# Patient Record
Sex: Female | Born: 1985 | Race: White | Hispanic: No | Marital: Married | Smoking: Current every day smoker
Health system: Southern US, Community
[De-identification: ages and names within clinical notes are randomized; demographics above are authoritative.]

## PROBLEM LIST (undated history)

## (undated) DIAGNOSIS — Z789 Other specified health status: Secondary | ICD-10-CM

## (undated) HISTORY — PX: OTHER SURGICAL HISTORY: SHX169

---
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
## 2005-01-04 ENCOUNTER — Emergency Department (HOSPITAL_COMMUNITY): Admission: EM | Admit: 2005-01-04 | Discharge: 2005-01-05 | Payer: Self-pay | Admitting: Emergency Medicine

## 2008-07-01 ENCOUNTER — Emergency Department (HOSPITAL_COMMUNITY): Admission: EM | Admit: 2008-07-01 | Discharge: 2008-07-01 | Payer: Self-pay | Admitting: Family Medicine

## 2008-08-19 ENCOUNTER — Emergency Department (HOSPITAL_COMMUNITY): Admission: EM | Admit: 2008-08-19 | Discharge: 2008-08-19 | Payer: Self-pay | Admitting: Family Medicine

## 2008-09-12 ENCOUNTER — Emergency Department (HOSPITAL_COMMUNITY): Admission: EM | Admit: 2008-09-12 | Discharge: 2008-09-12 | Payer: Self-pay | Admitting: Emergency Medicine

## 2008-09-17 ENCOUNTER — Emergency Department: Payer: Self-pay | Admitting: Emergency Medicine

## 2010-05-04 ENCOUNTER — Emergency Department (HOSPITAL_COMMUNITY)
Admission: EM | Admit: 2010-05-04 | Discharge: 2010-05-04 | Disposition: A | Discharge status: Home / Self Care | Payer: Medicaid Other | Attending: Emergency Medicine | Admitting: Emergency Medicine

## 2010-05-04 ENCOUNTER — Emergency Department (HOSPITAL_COMMUNITY): Payer: Medicaid Other

## 2010-05-04 DIAGNOSIS — M79609 Pain in unspecified limb: Secondary | ICD-10-CM | POA: Insufficient documentation

## 2010-05-04 DIAGNOSIS — S8990XA Unspecified injury of unspecified lower leg, initial encounter: Secondary | ICD-10-CM | POA: Insufficient documentation

## 2010-05-04 DIAGNOSIS — S9030XA Contusion of unspecified foot, initial encounter: Secondary | ICD-10-CM | POA: Insufficient documentation

## 2010-05-04 DIAGNOSIS — S93609A Unspecified sprain of unspecified foot, initial encounter: Secondary | ICD-10-CM | POA: Insufficient documentation

## 2010-05-04 DIAGNOSIS — Y990 Civilian activity done for income or pay: Secondary | ICD-10-CM | POA: Insufficient documentation

## 2010-05-04 DIAGNOSIS — S99919A Unspecified injury of unspecified ankle, initial encounter: Secondary | ICD-10-CM | POA: Insufficient documentation

## 2010-05-04 DIAGNOSIS — W19XXXA Unspecified fall, initial encounter: Secondary | ICD-10-CM | POA: Insufficient documentation

## 2010-05-05 LAB — POCT PREGNANCY, URINE: Preg Test, Ur: NEGATIVE

## 2010-05-23 LAB — URINE CULTURE: Colony Count: >=100000

## 2010-05-23 LAB — POCT URINALYSIS DIP (DEVICE)
Bilirubin Urine: NEGATIVE
pH: 6 (ref 5.0–8.0)

## 2010-12-10 IMAGING — CR DG TIBIA/FIBULA 2V*R*
2 series · 2 of 2 positions shown · non-contrast
Comparison: None

CLINICAL DATA: Pain and bruising after trauma

RIGHT TIBIA AND FIBULA - 2 VIEW

[view not recorded (1 of 2)]
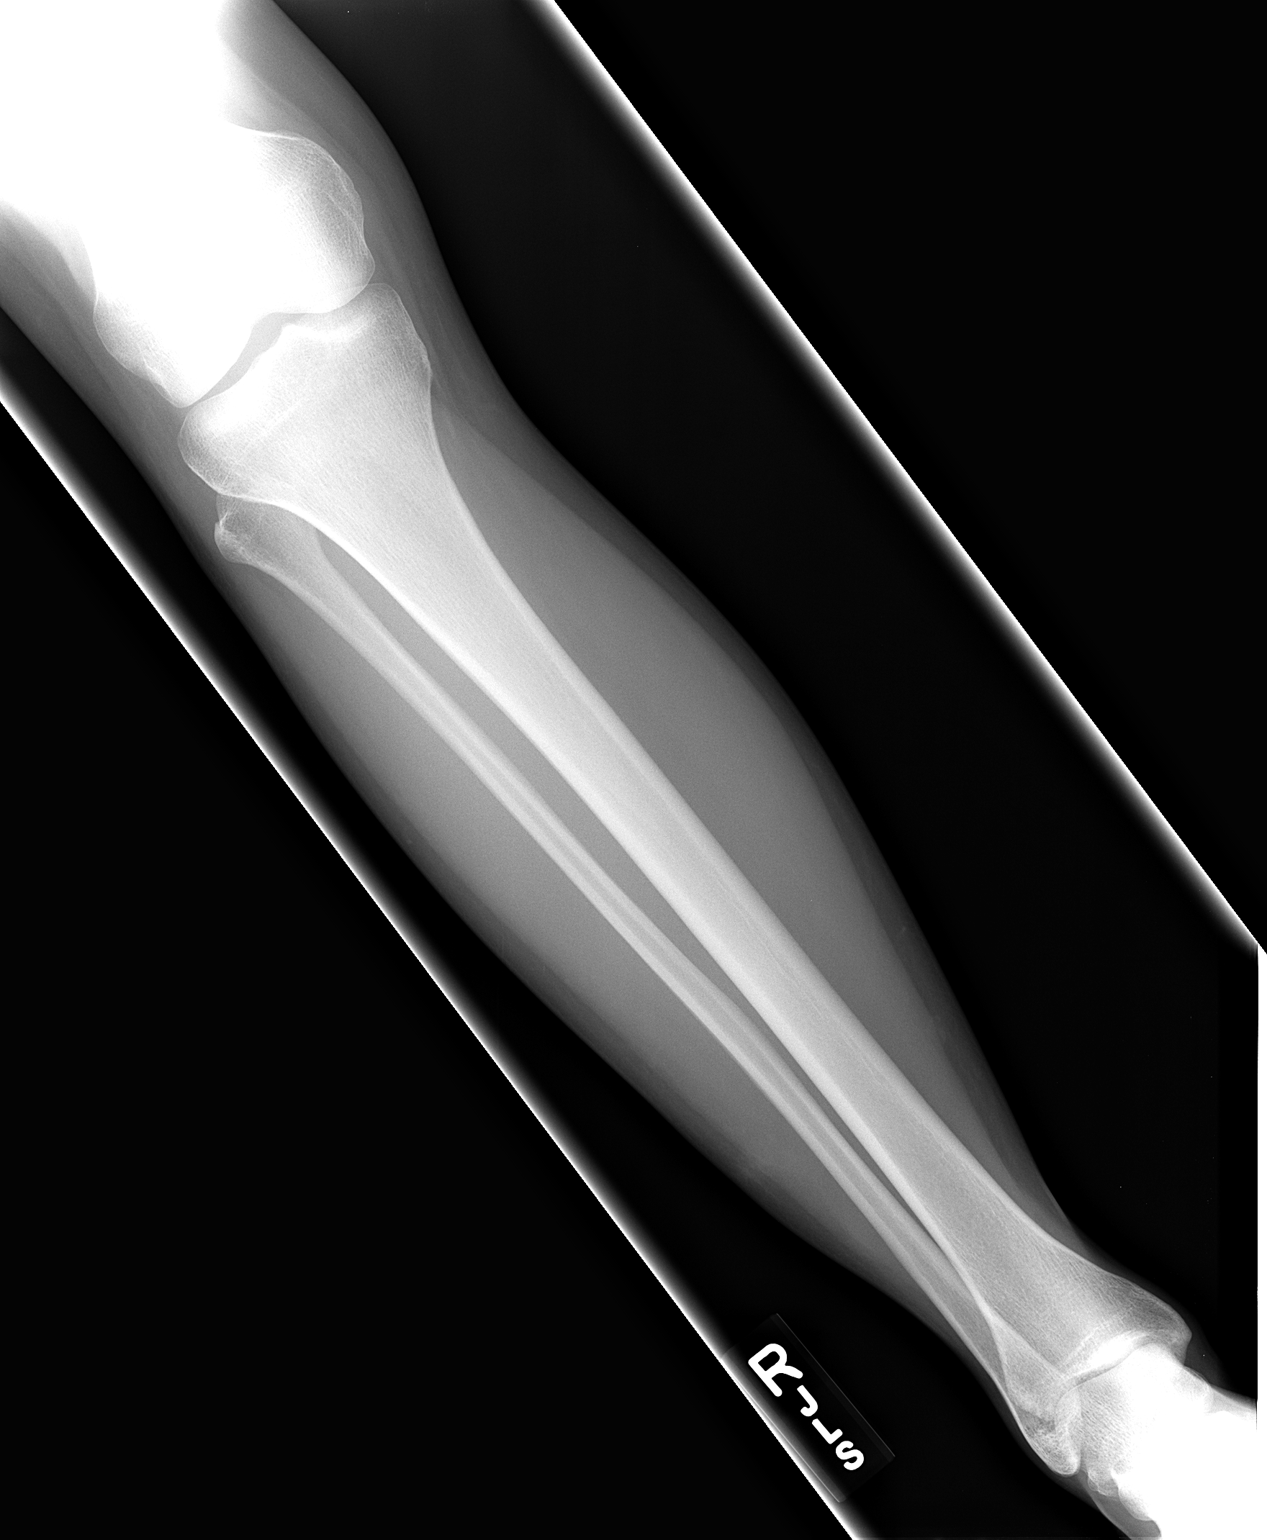

[view not recorded (2 of 2)]
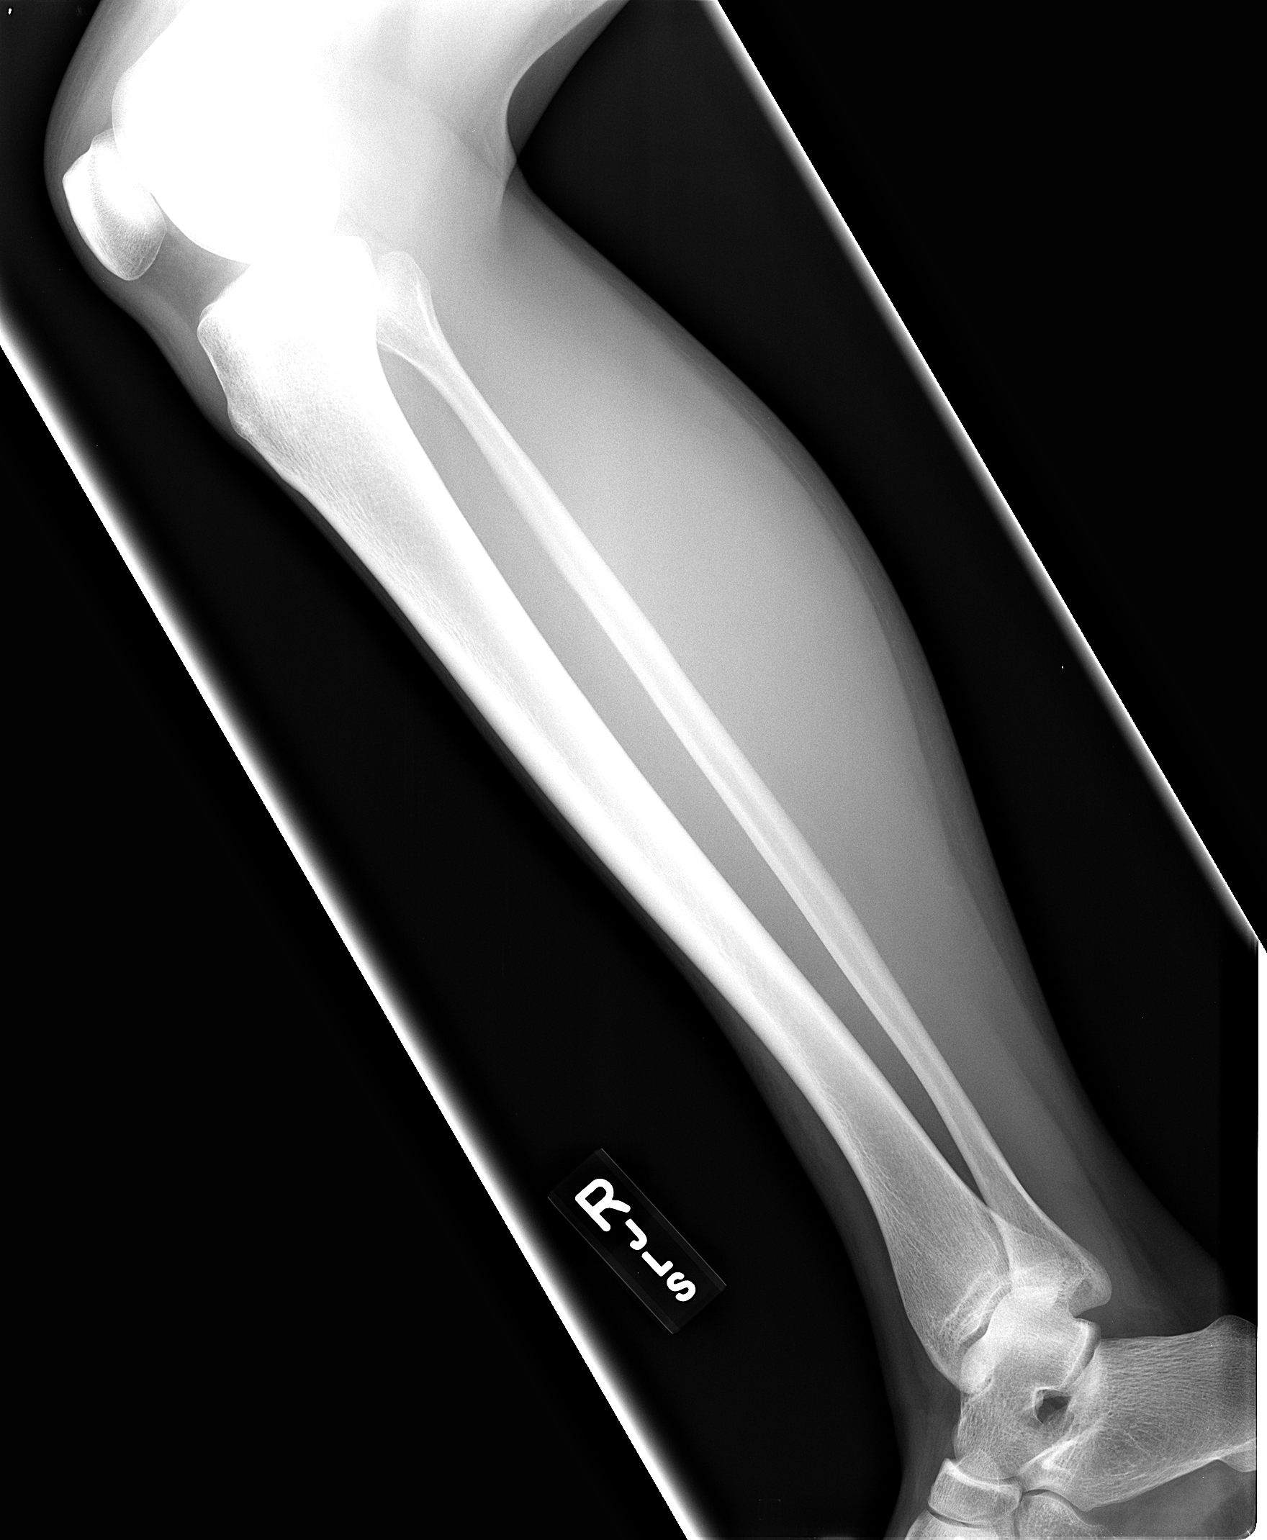

[2 of 2 positions shown; findings below may reference images not displayed]

FINDINGS: There is no evidence of bone, joint, or soft tissue
abnormality.
IMPRESSION: Negative right tib-fib series

## 2011-05-15 ENCOUNTER — Encounter (HOSPITAL_COMMUNITY): Payer: Self-pay

## 2011-05-15 ENCOUNTER — Emergency Department (HOSPITAL_COMMUNITY): Payer: Medicaid Other

## 2011-05-15 ENCOUNTER — Emergency Department (HOSPITAL_COMMUNITY)
Admission: EM | Admit: 2011-05-15 | Discharge: 2011-05-15 | Disposition: A | Discharge status: Home / Self Care | Payer: Medicaid Other | Attending: Emergency Medicine | Admitting: Emergency Medicine

## 2011-05-15 DIAGNOSIS — S322XXA Fracture of coccyx, initial encounter for closed fracture: Secondary | ICD-10-CM | POA: Insufficient documentation

## 2011-05-15 DIAGNOSIS — M533 Sacrococcygeal disorders, not elsewhere classified: Secondary | ICD-10-CM | POA: Insufficient documentation

## 2011-05-15 DIAGNOSIS — S3210XA Unspecified fracture of sacrum, initial encounter for closed fracture: Secondary | ICD-10-CM | POA: Insufficient documentation

## 2011-05-15 HISTORY — DX: Other specified health status: Z78.9

## 2011-05-15 MED ORDER — HYDROCODONE-ACETAMINOPHEN 5-325 MG PO TABS
2.0000 | ORAL_TABLET | Freq: Once | ORAL | Status: AC
  Administered 2011-05-15: 2 via ORAL
  Filled 2011-05-15: qty 2

## 2011-05-15 MED ORDER — HYDROCODONE-ACETAMINOPHEN 5-325 MG PO TABS: 1.0000 | ORAL_TABLET | ORAL | Status: AC | PRN
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
## 2011-05-15 NOTE — Discharge Instructions (Signed)
Tailbone Injury  The tailbone is the small bone at the lower end of the backbone (spine). You may have stretched tissues, bruises, or a broken bone (fracture). Most tailbone injuries get better on their own after 4 to 6 weeks.  HOME CARE  · Put ice on the injured area.  · Put ice in a plastic bag.  · Place a towel between your skin and the bag.  · Leave the ice on for 15 to 20 minutes. Do this every hour while you are awake for 1 to 2 days.  · Sit on a large, rubber or inflated ring or cushion to lessen pain. Lean forward when you sit to help lessen pain.  · Avoid sitting in one place for a long time.  · Increase your activity as the pain allows.  · Only take medicines as told by your doctor.  · You can take medicine to help you poop (stool softeners) if it is painful to poop.  · Eat foods with plenty of fiber.  · Keep all doctor visits as told.  GET HELP RIGHT AWAY IF:  · Your pain gets worse.  · Pooping causes you pain.  · You cannot poop (constipation).  · You have a fever.  MAKE SURE YOU:  · Understand these instructions.  · Will watch your condition.  · Will get help right away if you are not doing well or get worse.  Document Released: 03/05/2010 Document Revised: 01/20/2011 Document Reviewed: 08/26/2010  [employer]® Patient Information ©2012 [employer].
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
## 2011-05-15 NOTE — ED Provider Notes (Signed)
History     CSN: [id number]  Arrival date & time 05/15/11  1405   First MD Initiated Contact with Patient 05/15/11 1609      Chief Complaint  Patient presents with  . Fall    (Consider location/radiation/quality/duration/timing/severity/associated sxs/prior treatment) HPI  Past Medical History  Diagnosis Date  . No pertinent past medical history     Past Surgical History  Procedure Date  . No hisotory of surgeries     History reviewed. No pertinent family history.  History  Substance Use Topics  . Smoking status: Current Everyday Smoker  . Smokeless tobacco: Not on file  . Alcohol Use: No    OB History    Grav Para Term Preterm Abortions TAB SAB Ect Mult Living                  Review of Systems  Allergies  Review of patient's allergies indicates no known allergies.  Home Medications  No current outpatient prescriptions on file.  BP 131/82  Pulse 108  Temp(Src) 97.5 F (36.4 C) (Oral)  Resp 20  SpO2 100%  Physical Exam  ED Course  Procedures (including critical care time)  Labs Reviewed - No data to display Dg Sacrum/coccyx  05/15/2011  *RADIOLOGY REPORT*  Clinical Data: Fall.  Sacral pain.  SACRUM AND COCCYX - 2+ VIEW  Comparison: None.  Findings: IUD is present within the uterus.  There is step-off deformity involving the lower sacral segments just above the sacrococcygeal junction suspicious for minimally displaced sacral fracture.  There is considerable normal variation this region and clinical correlation is recommended.  IMPRESSION: Inferior sacral step-off deformity with anterior displacement of the sacral tip which could represent a mildly displaced sacral fracture. The acuity is unknown.  Original Report Authenticated By: Andreas Newport, M.D.     Sacral fracture    MDM  X-ray shows a mildly displaced sacral fracture - will provide the patient with pain control and will follow up with PCP for pain medication  refills.        Izola Price [place], [place] 05/15/11 1654

## 2011-05-16 NOTE — ED Provider Notes (Signed)
Medical screening examination/treatment/procedure(s) were conducted as a shared visit with non-physician practitioner(s) and myself.  I personally evaluated the patient during the encounter  Pt originally seen by me.  Please see my original H&P.    Gavin Pound. Sebastion Jun, MD 05/16/11 1728

## 2011-11-06 ENCOUNTER — Encounter (HOSPITAL_COMMUNITY): Payer: Self-pay | Admitting: *Deleted

## 2011-11-06 DIAGNOSIS — F17200 Nicotine dependence, unspecified, uncomplicated: Secondary | ICD-10-CM | POA: Insufficient documentation

## 2011-11-06 DIAGNOSIS — R1032 Left lower quadrant pain: Secondary | ICD-10-CM | POA: Insufficient documentation

## 2011-11-06 DIAGNOSIS — Z975 Presence of (intrauterine) contraceptive device: Secondary | ICD-10-CM | POA: Insufficient documentation

## 2011-11-06 LAB — CBC WITH DIFFERENTIAL/PLATELET
Basophils Absolute: 0 10*3/uL (ref 0.0–0.1)
Eosinophils Absolute: 0.4 10*3/uL (ref 0.0–0.7)
Eosinophils Relative: 4 % (ref 0–5)
Lymphs Abs: 2.7 10*3/uL (ref 0.7–4.0)
MCH: 31.9 pg (ref 26.0–34.0)
MCV: 90.5 fL (ref 78.0–100.0)
Platelets: 256 10*3/uL (ref 150–400)
RDW: 13.2 % (ref 11.5–15.5)

## 2011-11-06 NOTE — ED Notes (Signed)
Lower abd pain for one hour no nv or diarrhea no past  Medical history.  lmp none iud

## 2011-11-07 ENCOUNTER — Emergency Department (HOSPITAL_COMMUNITY)
Admission: EM | Admit: 2011-11-07 | Discharge: 2011-11-07 | Discharge status: Against Medical Advice | Payer: Medicaid Other | Attending: Emergency Medicine | Admitting: Emergency Medicine

## 2011-11-07 ENCOUNTER — Emergency Department (HOSPITAL_COMMUNITY): Payer: Medicaid Other

## 2011-11-07 DIAGNOSIS — R1032 Left lower quadrant pain: Secondary | ICD-10-CM

## 2011-11-07 LAB — COMPREHENSIVE METABOLIC PANEL
ALT: 9 U/L (ref 0–35)
Calcium: 9.5 mg/dL (ref 8.4–10.5)
GFR calc Af Amer: >90 mL/min (ref >90)
Glucose, Bld: 114 mg/dL — ABNORMAL HIGH (ref 70–99)
Sodium: 138 mEq/L (ref 135–145)
Total Protein: 7.5 g/dL (ref 6.0–8.3)

## 2011-11-07 LAB — URINALYSIS, ROUTINE W REFLEX MICROSCOPIC
Bilirubin Urine: NEGATIVE
Ketones, ur: NEGATIVE mg/dL
Nitrite: NEGATIVE
Protein, ur: NEGATIVE mg/dL
Urobilinogen, UA: 1 mg/dL (ref 0.0–1.0)

## 2011-11-07 LAB — WET PREP, GENITAL

## 2011-11-07 LAB — URINE MICROSCOPIC-ADD ON

## 2011-11-07 LAB — GC/CHLAMYDIA PROBE AMP, GENITAL: GC Probe Amp, Genital: NEGATIVE

## 2011-11-07 NOTE — ED Notes (Signed)
Patient cart set up at bedside.

## 2011-11-07 NOTE — ED Notes (Addendum)
Patient complaining of lower abdominal pain that started one hour ago; reports urinary frequency.  Denies nausea, vomiting, diarrhea, and chest pain.  Denies any other urinary changes.  Patient alert and oriented x4; PERRL present.  Upon arrival to room, patient changed into gown.  Will continue to monitor.

## 2011-11-07 NOTE — ED Provider Notes (Signed)
History     CSN: [id number]  Arrival date & time 11/06/11  2327   First MD Initiated Contact with Patient 11/07/11 0032      Chief Complaint  Patient presents with  . Abdominal Pain    (Consider location/radiation/quality/duration/timing/severity/associated sxs/prior treatment) HPI 26-year-old female presents emergency department complaining of lower abdominal pain. She reports onset of pain a few hours ago. Pain initially sharp like a knife and severe, followed by dull pain. She reports she has some pressure in her bottom. She denies any fever no chills no dysuria no vaginal discharge. LMP was over 2 years ago, patient has the Mirena implant in place. No. History of ovarian cyst. No prior surgeries. She denies any nausea or vomiting. No sick contacts. No trauma. Past Medical History  Diagnosis Date  . No pertinent past medical history     Past Surgical History  Procedure Date  . No hisotory of surgeries     No family history on file.  History  Substance Use Topics  . Smoking status: Current Every Day Smoker  . Smokeless tobacco: Not on file  . Alcohol Use: No    OB History    Grav Para Term Preterm Abortions TAB SAB Ect Mult Living                  Review of Systems  All other systems reviewed and are negative.    Allergies  Review of patient's allergies indicates no known allergies.  Home Medications  No current outpatient prescriptions on file.  BP 110/63  Pulse 123  Temp 97.6 F (36.4 C) (Oral)  Resp 20  SpO2 97%  Physical Exam  Nursing note and vitals reviewed. Constitutional: She is oriented to person, place, and time. She appears well-developed and well-nourished.  HENT:  Head: Normocephalic and atraumatic.  Nose: Nose normal.  Mouth/Throat: Oropharynx is clear and moist.  Eyes: Conjunctivae normal and EOM are normal. Pupils are equal, round, and reactive to light.  Neck: Normal range of motion. Neck supple. No JVD present. No tracheal  deviation present. No thyromegaly present.  Cardiovascular: Normal rate, regular rhythm, normal heart sounds and intact distal pulses.  Exam reveals no gallop and no friction rub.   No murmur heard. Pulmonary/Chest: Effort normal and breath sounds normal. No stridor. No respiratory distress. She has no wheezes. She has no rales. She exhibits no tenderness.  Abdominal: Soft. Bowel sounds are normal. She exhibits no distension and no mass. There is tenderness (suprapubic tenderness, mild in nature). There is no rebound and no guarding.  Genitourinary: Vagina normal. No vaginal discharge found.       No adnexal pain, masses. IUD strings in place.  Pain  Musculoskeletal: Normal range of motion. She exhibits no edema and no tenderness.  Lymphadenopathy:    She has no cervical adenopathy.  Neurological: She is alert and oriented to person, place, and time. She has normal reflexes. She exhibits normal muscle tone. Coordination normal.  Skin: Skin is dry. No rash noted. No erythema. No pallor.  Psychiatric: She has a normal mood and affect. Her behavior is normal. Judgment and thought content normal.    ED Course  Procedures (including critical care time)  Labs Reviewed  URINALYSIS, ROUTINE W REFLEX MICROSCOPIC - Abnormal; Notable for the following:    APPearance CLOUDY (*)     Leukocytes, UA SMALL (*)     All other components within normal limits  COMPREHENSIVE METABOLIC PANEL - Abnormal; Notable for the following:  Glucose, Bld 114 (*)     Total Bilirubin 0.2 (*)     All other components within normal limits  URINE MICROSCOPIC-ADD ON - Abnormal; Notable for the following:    Squamous Epithelial / LPF FEW (*)     Bacteria, UA MANY (*)     All other components within normal limits  WET PREP, GENITAL - Abnormal; Notable for the following:    Clue Cells Wet Prep HPF POC FEW (*)     WBC, Wet Prep HPF POC FEW (*)     All other components within normal limits  PREGNANCY, URINE  CBC WITH  DIFFERENTIAL  LIPASE, BLOOD  GC/CHLAMYDIA PROBE AMP, GENITAL  URINE CULTURE   No results found.   1. Abdominal pain, left lower quadrant       MDM  26 year old female with lower abdominal pain. To have pelvic exam done. Patient noted to be significantly tachycardic upon arrival, will recheck this. Lab work thus far shows slightly elevated glucose, many bacteria in the urine with 11-20 white blood cells. She denies any dysuria, will send this for culture. Symptoms seem consistent with possible ruptured ovarian cyst, differential also includes ovarian torsion with intermittent torsion, less likely to be appendicitis or colitis  Patient left AMA as ultrasound came to do her evaluation. She reports she wishes to follow up with her gynecologist. I discussed with the patient risk for possible ovarian torsion causing loss of an ovary and/or infertility, the patient does not wish to stay.        Olivia Mackie, MD 11/07/11 [phone]

## 2011-11-07 NOTE — ED Notes (Addendum)
Ultrasound here to transport patient; patient refusing to go to ultrasound -- states that she is ready to go home because she needs to be with her child.  Encouraged patient to stay; patient states that she wants to just follow up with her OB/GYN.  Dr. Norlene Campbell notified; Dr. Norlene Campbell spoke with patient that her condition could be serious and we need an ultrasound for further testing.  Patient verbalized understanding of risks of leaving hospital against medical advice, including possible death.  Patient left hospital AMA; signed electronic AMA form.  Patient refused discharge vitals.

## 2011-11-07 NOTE — ED Notes (Signed)
Patient currently sitting up in bed; no respiratory or acute distress noted.  Patient updated on plan of care; informed patient that she is going to be sent for an ultrasound. Patient has no other questions or concerns at this time; will continue to monitor.

## 2011-11-09 LAB — URINE CULTURE

## 2013-09-05 ENCOUNTER — Other Ambulatory Visit (HOSPITAL_COMMUNITY)
Admission: RE | Admit: 2013-09-05 | Discharge: 2013-09-05 | Disposition: A | Discharge status: Home / Self Care | Payer: Medicaid Other | Source: Ambulatory Visit | Attending: Nurse Practitioner | Admitting: Nurse Practitioner

## 2013-09-05 ENCOUNTER — Other Ambulatory Visit: Payer: Self-pay | Admitting: Nurse Practitioner

## 2013-09-05 DIAGNOSIS — Z113 Encounter for screening for infections with a predominantly sexual mode of transmission: Secondary | ICD-10-CM | POA: Diagnosis present

## 2013-09-05 DIAGNOSIS — Z124 Encounter for screening for malignant neoplasm of cervix: Secondary | ICD-10-CM | POA: Diagnosis present

## 2013-09-09 LAB — CYTOLOGY - PAP

## 2015-07-18 ENCOUNTER — Emergency Department (HOSPITAL_COMMUNITY)
Admission: EM | Admit: 2015-07-18 | Discharge: 2015-07-18 | Disposition: A | Discharge status: Home / Self Care | Payer: Medicaid Other | Attending: Emergency Medicine | Admitting: Emergency Medicine

## 2015-07-18 ENCOUNTER — Encounter (HOSPITAL_COMMUNITY): Payer: Self-pay | Admitting: *Deleted

## 2015-07-18 DIAGNOSIS — F17210 Nicotine dependence, cigarettes, uncomplicated: Secondary | ICD-10-CM | POA: Insufficient documentation

## 2015-07-18 DIAGNOSIS — R6884 Jaw pain: Secondary | ICD-10-CM

## 2015-07-18 DIAGNOSIS — K029 Dental caries, unspecified: Secondary | ICD-10-CM | POA: Insufficient documentation

## 2015-07-18 MED ORDER — AMOXICILLIN-POT CLAVULANATE 875-125 MG PO TABS: 1.0000 | ORAL_TABLET | Freq: Two times a day (BID) | ORAL | Status: DC

## 2015-07-18 MED ORDER — HYDROCODONE-ACETAMINOPHEN 5-325 MG PO TABS: 2.0000 | ORAL_TABLET | ORAL | Status: DC | PRN

## 2015-07-18 NOTE — ED Provider Notes (Signed)
CSN: [id number]     Arrival date & time 07/18/15  0944 History  By signing my name below, I, Placido SouLogan Joldersma, attest that this documentation has been prepared under the direction and in the presence of Bethel BornKelly Marie Diora Bellizzi, PA-C. Electronically Signed: Placido SouLogan Joldersma, ED Scribe. 07/18/2015. 10:03 AM.   Chief Complaint  Patient presents with  . Dental Pain   The history is provided by the patient. No language interpreter was used.    HPI Comments: Destiny Rasmussen is a 30 y.o. female who presents to the Emergency Department complaining of worsening, moderate, left lower dental pain x 2 days. She reports having a broken tooth in the region for the past 2 months which she states she needs to have removed. Pt went to a walk in dental clinic this morning which had no availability and was instructed to come to the ED today and back to the dentist's office in 3 days. Pt reports associated, mild, swelling surrounding the region. Her pain worsens with jaw movement and palpation and radiates down her left jaw into her left upper neck. She has taken Aleve, naproxen and ibuprofen without relief. She states she was taking 400 mg ibuprofen every 3 hours initially and has cut back to every 4 hours beginning today. She denies a hx of abscesses but reports a hx of multiple tooth extractions and states she has dentures in place in her upper mouth. She denies difficulty swallowing, redness, drainage, fevers and ear pain.   Past Medical History  Diagnosis Date  . No pertinent past medical history    Past Surgical History  Procedure Laterality Date  . No hisotory of surgeries     No family history on file. Social History  Substance Use Topics  . Smoking status: Current Every Day Smoker -- 0.50 packs/day    Types: Cigarettes  . Smokeless tobacco: Never Used  . Alcohol Use: No   OB History    No data available     Review of Systems  Constitutional: Negative for fever.  HENT: Positive for dental problem and  facial swelling. Negative for ear pain and trouble swallowing.   Skin: Negative for color change.    Allergies  Review of patient's allergies indicates no known allergies.  Home Medications   Prior to Admission medications   Not on File   BP 101/77 mmHg  Pulse 105  Temp(Src) 99 F (37.2 C) (Oral)  Resp 19  Ht 5\' 5"  (1.651 m)  Wt 127 lb (57.607 kg)  BMI 21.13 kg/m2  SpO2 96%    Physical Exam  Constitutional: She is oriented to person, place, and time. She appears well-developed and well-nourished. No distress.  HENT:  Head: Normocephalic and atraumatic.  Mouth/Throat: Uvula is midline, oropharynx is clear and moist and mucous membranes are normal. Abnormal dentition. Dental caries present. No dental abscesses.  Multiple dental caries. Moderate amount of swelling and redness over mandibular wisdom tooth. No abscess or drainage noted. Moderate amount of left jaw swelling with tenderness to palpation. Patient is able to open and close her jaw. She is able to swallow her secretions. No trismus.  Eyes: Conjunctivae and EOM are normal. Pupils are equal, round, and reactive to light. Right eye exhibits no discharge. Left eye exhibits no discharge. No scleral icterus.  Neck: Normal range of motion.  Cardiovascular: Normal rate.   Pulmonary/Chest: Effort normal. No respiratory distress.  Abdominal: Soft. She exhibits no distension.  Musculoskeletal: Normal range of motion.  Neurological: She is alert and  oriented to person, place, and time.  Skin: Skin is warm and dry.  Psychiatric: She has a normal mood and affect. Her behavior is normal.  Nursing note and vitals reviewed.   ED Course  Procedures  DIAGNOSTIC STUDIES: Oxygen Saturation is 96% on RA, normal by my interpretation.    COORDINATION OF CARE: 10:20 AM Pt presents with left lower dental pain. Discussed a dental block with the pt which she declined. Pt verbalized understanding and is agreeable with the plan.   Labs  Review Labs Reviewed - No data to display  Imaging Review No results found. I have personally reviewed and evaluated these images and lab results as part of my medical decision-making.   EKG Interpretation None      MDM   Final diagnoses:  Pain due to dental caries  Jaw pain   Patient with dentalgia.  No abscess requiring immediate incision and drainage.  Exam not concerning for Ludwig's angina or pharyngeal abscess.  Shared visit with Dr. Rosalia Hammers.  Will treat with Augmentin. Pt declined dental block. Recommended Ibupofen and norco. Pt instructed to follow-up with dentist.  Discussed return precautions. Pt safe for discharge.  I personally performed the services described in this documentation, which was scribed in my presence. The recorded information has been reviewed and is accurate.   Bethel Born, PA-C 07/18/15 1444  Margarita Grizzle, MD 07/18/15 [phone]

## 2015-07-18 NOTE — ED Notes (Signed)
C/o lower left tooth pain that radiates to jaw/neck.

## 2015-12-11 ENCOUNTER — Ambulatory Visit (HOSPITAL_COMMUNITY)
Admission: EM | Admit: 2015-12-11 | Discharge: 2015-12-11 | Disposition: A | Discharge status: Home / Self Care | Payer: Medicaid Other | Attending: Internal Medicine | Admitting: Internal Medicine

## 2015-12-11 ENCOUNTER — Encounter (HOSPITAL_COMMUNITY): Payer: Self-pay | Admitting: Family Medicine

## 2015-12-11 DIAGNOSIS — K029 Dental caries, unspecified: Secondary | ICD-10-CM

## 2015-12-11 MED ORDER — DICLOFENAC SODIUM 75 MG PO TBEC: DELAYED_RELEASE_TABLET | ORAL | 0 refills | Status: DC

## 2015-12-11 MED ORDER — AMOXICILLIN-POT CLAVULANATE 875-125 MG PO TABS: 1.0000 | ORAL_TABLET | Freq: Two times a day (BID) | ORAL | 0 refills | Status: DC

## 2015-12-11 NOTE — ED Provider Notes (Signed)
CSN: [id number]     Arrival date & time 12/11/15  1200 History   None    Chief Complaint  Patient presents with  . Dental Pain   (Consider location/radiation/quality/duration/timing/severity/associated sxs/prior Treatment) 30 yo presents with reoccurring right lower dental pain. Onset <24, but worsening throughout the day. Past infection in June along same area. She improved following an antibiotic. She has not been able to afford a dentist and therefore feels this is infected again. She has pain that radiates into upper jaw and ear. No fever or chills. Denies swollen glands.     Past Medical History:  Diagnosis Date  . No pertinent past medical history    Past Surgical History:  Procedure Laterality Date  . no hisotory of surgeries     History reviewed. No pertinent family history. Social History  Substance Use Topics  . Smoking status: Current Every Day Smoker    Packs/day: 0.50    Types: Cigarettes  . Smokeless tobacco: Never Used  . Alcohol use No   OB History    No data available     Review of Systems  Constitutional: Negative for chills and fever.  HENT: Positive for dental problem. Negative for sinus pressure.   Respiratory: Negative for cough.   Skin: Negative for rash.    Allergies  Review of patient's allergies indicates no known allergies.  Home Medications   Prior to Admission medications   Medication Sig Start Date End Date Taking? Authorizing Provider  amoxicillin-clavulanate (AUGMENTIN) 875-125 MG tablet Take 1 tablet by mouth 2 (two) times daily. 12/11/15   Riki SheerMichelle G Young, PA-C  diclofenac (VOLTAREN) 75 MG EC tablet 1 po bid for dental pain and inflammation 12/11/15   Riki SheerMichelle G Young, PA-C   Meds Ordered and Administered this Visit  Medications - No data to display  BP 129/84   Pulse 96   Temp 99 F (37.2 C)   Resp 18   SpO2 98%  No data found.   Physical Exam  Constitutional: She appears well-developed and well-nourished. No  distress.  HENT:  2 Right lower molars are broken, with gum disease noted. Erythema along right gum line with mild swelling, no frank abscess is noted. Tenderness to palpation with tongue blade. No oropharynx exudate and uvula in place.   Pulmonary/Chest: Effort normal.  Lymphadenopathy:    She has no cervical adenopathy.  Skin: Skin is warm and dry. She is not diaphoretic.  Psychiatric: Her behavior is normal.  Nursing note and vitals reviewed.   Urgent Care Course   Clinical Course    Procedures (including critical care time)  Labs Review Labs Reviewed - No data to display  Imaging Review No results found.   Visual Acuity Review  Right Eye Distance:   Left Eye Distance:   Bilateral Distance:    Right Eye Near:   Left Eye Near:    Bilateral Near:         MDM   1. Pain due to dental caries   Treat with antibiotic and NSAIDs. If worsens or develops high fevers please f/u in the ED, otherwise try to dental visit/tooth extraction.     Riki SheerMichelle G Young, PA-C 12/11/15 1345

## 2015-12-11 NOTE — Discharge Instructions (Signed)
You appear to have an infection from the broken teeth. Will treat with antibiotic x 10 days and use of pain/inflammatory medications. Print of dentist that may take payments to have these teeth pulled. Feel better.

## 2015-12-11 NOTE — ED Triage Notes (Signed)
Pt here for right lower dental pain since yesterday.

## 2016-10-17 ENCOUNTER — Encounter: Payer: Self-pay | Admitting: Emergency Medicine

## 2016-10-17 ENCOUNTER — Emergency Department: Payer: Self-pay

## 2016-10-17 ENCOUNTER — Emergency Department
Admission: EM | Admit: 2016-10-17 | Discharge: 2016-10-17 | Disposition: A | Discharge status: Home / Self Care | Payer: Self-pay | Attending: Emergency Medicine | Admitting: Emergency Medicine

## 2016-10-17 DIAGNOSIS — F17210 Nicotine dependence, cigarettes, uncomplicated: Secondary | ICD-10-CM | POA: Insufficient documentation

## 2016-10-17 DIAGNOSIS — R062 Wheezing: Secondary | ICD-10-CM | POA: Insufficient documentation

## 2016-10-17 DIAGNOSIS — R0902 Hypoxemia: Secondary | ICD-10-CM | POA: Insufficient documentation

## 2016-10-17 LAB — CBC WITH DIFFERENTIAL/PLATELET
BASOS ABS: 0.1 10*3/uL (ref 0–0.1)
BASOS PCT: 1 %
EOS ABS: 0.6 10*3/uL (ref 0–0.7)
Eosinophils Relative: 6 %
HCT: 46.1 % (ref 35.0–47.0)
HEMOGLOBIN: 15.5 g/dL (ref 12.0–16.0)
Lymphocytes Relative: 16 %
Lymphs Abs: 1.8 10*3/uL (ref 1.0–3.6)
MCH: 30.9 pg (ref 26.0–34.0)
MCHC: 33.7 g/dL (ref 32.0–36.0)
MCV: 91.8 fL (ref 80.0–100.0)
MONOS PCT: 4 %
Monocytes Absolute: 0.5 10*3/uL (ref 0.2–0.9)
NEUTROS PCT: 73 %
Neutro Abs: 8.2 10*3/uL — ABNORMAL HIGH (ref 1.4–6.5)
PLATELETS: 329 10*3/uL (ref 150–440)
RBC: 5.02 MIL/uL (ref 3.80–5.20)
RDW: 12.9 % (ref 11.5–14.5)
WBC: 11.1 10*3/uL — ABNORMAL HIGH (ref 3.6–11.0)

## 2016-10-17 LAB — BASIC METABOLIC PANEL
Anion gap: 6 (ref 5–15)
BUN: 7 mg/dL (ref 6–20)
CALCIUM: 9 mg/dL (ref 8.9–10.3)
CO2: 26 mmol/L (ref 22–32)
Chloride: 107 mmol/L (ref 101–111)
Creatinine, Ser: 0.54 mg/dL (ref 0.44–1.00)
GLUCOSE: 98 mg/dL (ref 65–99)
POTASSIUM: 3.7 mmol/L (ref 3.5–5.1)
SODIUM: 139 mmol/L (ref 135–145)

## 2016-10-17 LAB — TROPONIN I

## 2016-10-17 MED ORDER — METHYLPREDNISOLONE SODIUM SUCC 125 MG IJ SOLR
80.0000 mg | Freq: Once | INTRAMUSCULAR | Status: AC
  Administered 2016-10-17: 80 mg via INTRAVENOUS
  Filled 2016-10-17: qty 2

## 2016-10-17 MED ORDER — IPRATROPIUM-ALBUTEROL 0.5-2.5 (3) MG/3ML IN SOLN
RESPIRATORY_TRACT | Status: AC
  Filled 2016-10-17: qty 9

## 2016-10-17 MED ORDER — IPRATROPIUM-ALBUTEROL 0.5-2.5 (3) MG/3ML IN SOLN
3.0000 mL | Freq: Once | RESPIRATORY_TRACT | Status: AC
  Administered 2016-10-17: 3 mL via RESPIRATORY_TRACT

## 2016-10-17 MED ORDER — ALBUTEROL SULFATE (2.5 MG/3ML) 0.083% IN NEBU
INHALATION_SOLUTION | RESPIRATORY_TRACT | Status: AC
  Administered 2016-10-17: 2.5 mg via RESPIRATORY_TRACT
  Filled 2016-10-17: qty 6

## 2016-10-17 MED ORDER — ALBUTEROL SULFATE HFA 108 (90 BASE) MCG/ACT IN AERS: 2.0000 | INHALATION_SPRAY | Freq: Four times a day (QID) | RESPIRATORY_TRACT | 0 refills | Status: AC | PRN

## 2016-10-17 MED ORDER — PREDNISONE 10 MG PO TABS: ORAL_TABLET | ORAL | 0 refills | Status: DC

## 2016-10-17 MED ORDER — ALBUTEROL SULFATE (2.5 MG/3ML) 0.083% IN NEBU
5.0000 mg | INHALATION_SOLUTION | Freq: Once | RESPIRATORY_TRACT | Status: AC
  Administered 2016-10-17: 2.5 mg via RESPIRATORY_TRACT

## 2016-10-17 NOTE — Discharge Instructions (Signed)
You were evaluated for trouble breathing and found to have wheezing also called bronco spasm. You were treated in the emergency department with breathing treatment and now being prescribed prednisone for lung inflammation as well as albuterol inhaler.  Return to the emergency room immediately for any worsening trouble breathing, chest pain or chest pain with breathing, fever, or any other symptoms concerning to you.  As we discussed, your oxygen level was borderline, make sure that you are resting and not exerting yourself.

## 2016-10-17 NOTE — ED Notes (Signed)
Pt discharged to home.  Family member driving.  Discharge instructions reviewed.  Verbalized understanding.  No questions or concerns at this time.  Teach back verified.  Pt in NAD.  No items left in ED.   

## 2016-10-17 NOTE — ED Provider Notes (Signed)
[facility] Emergency Department Provider Note ____________________________________________   I have reviewed the triage vital signs and the triage nursing note.  HISTORY  Chief Complaint Shortness of Breath   Historian Patient  HPI Destiny Rasmussen is a 31 y.o. female with no significant past medical history other she is a smoker, minimal shortness of breath this morning it started at work. Patient found to be hypoxic on arrival and significantly wheezing.  Chest tightness without chest pain. No nausea or vomiting. No fever or productive sputum. Symptoms are moderate to severe in terms of shortness of breath.    Past Medical History:  Diagnosis Date  . No pertinent past medical history     There are no active problems to display for this patient.   Past Surgical History:  Procedure Laterality Date  . no hisotory of surgeries      Prior to Admission medications   Medication Sig Start Date End Date Taking? Authorizing Provider  amoxicillin-clavulanate (AUGMENTIN) 875-125 MG tablet Take 1 tablet by mouth 2 (two) times daily. Patient not taking: Reported on 10/17/2016 12/11/15   Riki SheerYoung, Michelle G, PA-C  diclofenac (VOLTAREN) 75 MG EC tablet 1 po bid for dental pain and inflammation Patient not taking: Reported on 10/17/2016 12/11/15   Riki SheerYoung, Michelle G, PA-C    No Known Allergies  History reviewed. No pertinent family history.  Social History Social History  Substance Use Topics  . Smoking status: Current Every Day Smoker    Packs/day: 0.50    Types: Cigarettes  . Smokeless tobacco: Never Used  . Alcohol use No    Review of Systems  Constitutional: Negative for fever. Eyes: Negative for visual changes. ENT: Negative for sore throat. Cardiovascular: Negative for chest pain. Respiratory: Positive for shortness of breath. Gastrointestinal: Negative for abdominal pain, vomiting and diarrhea. Genitourinary: Negative for  dysuria. Musculoskeletal: Negative for back pain. Skin: Negative for rash. Neurological: Negative for headache.  ____________________________________________   PHYSICAL EXAM:  VITAL SIGNS: ED Triage Vitals  Enc Vitals Group     BP 10/17/16 1251 (!) 143/99     Pulse Rate 10/17/16 1251 85     Resp 10/17/16 1251 (!) 28     Temp 10/17/16 1251 98.7 F (37.1 C)     Temp Source 10/17/16 1251 Oral     SpO2 10/17/16 1251 (!) 88 %     Weight --      Height --      Head Circumference --      Peak Flow --      Pain Score 10/17/16 1250 0     Pain Loc --      Pain Edu? --      Excl. in GC? --      Constitutional: Alert and oriented. Moderate respiratory distress with tachypnea and wheezing. HEENT   Head: Normocephalic and atraumatic.      Eyes: Conjunctivae are normal. Pupils equal and round.       Ears:         Nose: No congestion/rhinnorhea.   Mouth/Throat: Mucous membranes are moist.   Neck: No stridor. Cardiovascular/Chest: Normal rate, regular rhythm.  No murmurs, rubs, or gallops. Respiratory: Tachypnea with mild subcostal retractions. Significant wheezing in all fields. No rhonchi or rales. Gastrointestinal: Soft. No distention, no guarding, no rebound. Nontender.    Genitourinary/rectal:Deferred Musculoskeletal: Nontender with normal range of motion in all extremities. No joint effusions.  No lower extremity tenderness.  No edema. Neurologic:  Normal speech and language. No  gross or focal neurologic deficits are appreciated. Skin:  Skin is warm, dry and intact. No rash noted. Psychiatric: Mood and affect are normal. Speech and behavior are normal. Patient exhibits appropriate insight and judgment.   ____________________________________________  LABS (pertinent positives/negatives)  Labs Reviewed  CBC WITH DIFFERENTIAL/PLATELET - Abnormal; Notable for the following:       Result Value   WBC 11.1 (*)    Neutro Abs 8.2 (*)    All other components within  normal limits  BASIC METABOLIC PANEL  TROPONIN I    ____________________________________________    EKG I, Governor Rooks, MD, the attending physician have personally viewed and interpreted all ECGs.  96 bpm. Normal sinus rhythm. Narrow QRS. Normal axis. Normal ST and T-wave ____________________________________________  RADIOLOGY All Xrays were viewed by me. Imaging interpreted by Radiologist.  Chest x-ray portable:  IMPRESSION: No active disease. __________________________________________  PROCEDURES  Procedure(s) performed: None  Critical Care performed: None  ____________________________________________   ED COURSE / ASSESSMENT AND PLAN  Pertinent labs & imaging results that were available during my care of the patient were reviewed by me and considered in my medical decision making (see chart for details).   Destiny Rasmussen is here hypoxic with significant wheezing. Although she does not have a history of asthma, she is a smoker.  She is denying history of pneumonia symptoms.  Will obtain chest x-ray, currently treating for wheezing/COPD given smoking history and clinical picture.  Patient has no personal or family history of DVT. No risks factors or lower extremity swelling or edema.  X-ray clear. Laboratory studies are overall reassuring. White blood cell count is minimally elevated. Reevaluation after DuoNeb and albuterol, patient is nearly resolved in terms of wheezing. She has minimal wheezing with forced exhalation.  This seems to be a diagnosis of bronchospasm, unsure what is the inciting event, but she does appear stable.  I was able to walk perfectly around the emergency Department loop and she had no trouble holding a conversation, her O2 sat did briefly drop from 90% at rest to 88% without any tachycardia or dyspnea. And then went back up to 93%.  I did discuss with her to rest, and certainly return immediately for any worsening condition. I don't have a  suspicion for underlying PE at this point. I think it's bronchospasm.  CONSULTATIONS:  None   Patient / Family / Caregiver informed of clinical course, medical decision-making process, and agree with plan.   I discussed return precautions, follow-up instructions, and discharge instructions with patient and/or family.  Discharge Instructions : You were evaluated for trouble breathing and found to have wheezing also called bronco spasm. You were treated in the emergency department with breathing treatment and now being prescribed prednisone for lung inflammation as well as albuterol inhaler.  Return to the emergency room immediately for any worsening trouble breathing, chest pain or chest pain with breathing, fever, or any other symptoms concerning to you.  As we discussed, your oxygen level was borderline, make sure that you are resting and not exerting yourself.  ___________________________________________   FINAL CLINICAL IMPRESSION(S) / ED DIAGNOSES   Final diagnoses:  Wheezing  Hypoxia              Note: This dictation was prepared with Dragon dictation. Any transcriptional errors that result from this process are unintentional    Governor Rooks, MD 10/17/16 1513

## 2016-10-17 NOTE — ED Triage Notes (Addendum)
Pt to ed with difficulty breathing and sob at work today.  Pt with labored resp at triage.  Pt alert but reports it is hard to breathe.  Pt sats 88% on RA. Pt taken straight to room 4 for treatment/

## 2018-10-25 ENCOUNTER — Other Ambulatory Visit (HOSPITAL_COMMUNITY)
Admission: RE | Admit: 2018-10-25 | Discharge: 2018-10-25 | Disposition: A | Discharge status: Home / Self Care | Payer: Managed Care, Other (non HMO) | Source: Ambulatory Visit | Attending: Nurse Practitioner | Admitting: Nurse Practitioner

## 2018-10-25 ENCOUNTER — Other Ambulatory Visit: Payer: Self-pay | Admitting: Nurse Practitioner

## 2018-10-25 DIAGNOSIS — Z124 Encounter for screening for malignant neoplasm of cervix: Secondary | ICD-10-CM | POA: Diagnosis not present

## 2018-10-28 LAB — CYTOLOGY - PAP
Diagnosis: UNDETERMINED — AB
HPV: NOT DETECTED

## 2018-10-30 ENCOUNTER — Encounter: Payer: Self-pay | Admitting: Family Medicine

## 2018-10-30 ENCOUNTER — Ambulatory Visit (INDEPENDENT_AMBULATORY_CARE_PROVIDER_SITE_OTHER): Payer: Managed Care, Other (non HMO) | Admitting: Family Medicine

## 2018-10-30 DIAGNOSIS — K429 Umbilical hernia without obstruction or gangrene: Secondary | ICD-10-CM | POA: Diagnosis not present

## 2018-10-30 DIAGNOSIS — M533 Sacrococcygeal disorders, not elsewhere classified: Secondary | ICD-10-CM | POA: Diagnosis not present

## 2018-10-30 MED ORDER — TIZANIDINE HCL 2 MG PO TABS: 2.0000 mg | ORAL_TABLET | Freq: Every evening | ORAL | 1 refills | Status: DC | PRN

## 2018-10-30 MED ORDER — NABUMETONE 750 MG PO TABS: 750.0000 mg | ORAL_TABLET | Freq: Two times a day (BID) | ORAL | 6 refills | Status: DC | PRN

## 2018-10-30 NOTE — Progress Notes (Signed)
I saw and examined the patient with Dr. Mayer Masker and agree with assessment and plan as outlined.  Exam consistent with right SI dysfunction.  Will try [facility] in [place].  PT if not improved.  X-rays if fails conservative management.  Meds given.

## 2018-10-30 NOTE — Progress Notes (Signed)
Destiny BertholdLauren Rasmussen - 33 y.o. female MRN [id number]  Date of birth: January 16, 1986  Office Visit Note: Visit Date: 10/30/2018 PCP: Default, Provider, MD Referred by: No ref. provider found  Subjective: Chief Complaint  Patient presents with  . Lower Back - Pain    Pain x months. NKI. Some days are worse than others. Pain on right side, occ radiates down leg. Worsens the longer she stands.  . tender area, umbilical area   HPI: Destiny Rasmussen is a 33 y.o. female who comes in today with intermittent right sided back pain for the past several months. Pain is worse with prolonged standing. She gets relief by bending forward. Had pain down leg to knee one time when she had been standing for hours and the pain was excruciating. No numbness or tingling. No known inciting event or injury. She works at [employer] and lifts heavy items often.   Injured coccus in 2013 and has occasional pain from that, otherwise no history of back pain. Has three children, last born 9 years ago.    ROS Otherwise per HPI.  Assessment & Plan: Visit Diagnoses:  1. Sacroiliac joint dysfunction of right side   2. Umbilical hernia without obstruction and without gangrene    Plan:  SI Joint Dysfunction: - medications as below - PT referral. Also provided contact information for chiropractor as well  Umbilical hernia: sensitive to touch but is not interested in surgical repair at this time. Easily reducible on exam, small defect. Continue to monitor clinically    Meds & Orders:  Meds ordered this encounter  Medications  . nabumetone (RELAFEN) 750 MG tablet    Sig: Take 1 tablet (750 mg total) by mouth 2 (two) times daily as needed.    Dispense:  60 tablet    Refill:  6  . tiZANidine (ZANAFLEX) 2 MG tablet    Sig: Take 1-2 tablets (2-4 mg total) by mouth at bedtime as needed for muscle spasms.    Dispense:  60 tablet    Refill:  1    Orders Placed This Encounter  Procedures  . Ambulatory referral to Physical  Therapy    Follow-up: as needed   Procedures: No procedures performed  No notes on file   Clinical History: No specialty comments available.   She reports that she has been smoking cigarettes. She has been smoking about 0.50 packs per day. She has never used smokeless tobacco. No results for input(s): HGBA1C, LABURIC in the last 8760 hours.  Objective:  VS:  HT:    WT:   BMI:     BP:   HR: bpm  TEMP: ( )  RESP:  Physical Exam  PHYSICAL EXAM: Gen: NAD, alert, cooperative with exam, well-appearing HEENT: clear conjunctiva,  CV:  no edema, capillary refill brisk, normal rate Resp: non-labored Abdomen: small defect noted in umbilical area,   Skin: no rashes, normal turgor  Neuro: no gross deficits.  Psych:  alert and oriented  Ortho Exam  Lumbar spine: - Inspection: no gross deformity or asymmetry, swelling or ecchymosis - Palpation: TTP over right SI joint  - ROM: full active ROM of the lumbar spine in flexion and extension without pain - Strength: 5/5 strength of lower extremity in L4-S1 nerve root distributions b/l; normal gait - Neuro: sensation intact in the L4-S1 nerve root distribution b/l,  - Special testing: positive Stork test on right side, Positive FABER on right  Imaging: No results found.  Past Medical/Family/Surgical/Social History: Medications & Allergies  reviewed per EMR, new medications updated. There are no active problems to display for this patient.  Past Medical History:  Diagnosis Date  . No pertinent past medical history    History reviewed. No pertinent family history. Past Surgical History:  Procedure Laterality Date  . no hisotory of surgeries     Social History   Occupational History  . Not on file  Tobacco Use  . Smoking status: Current Every Day Smoker    Packs/day: 0.50    Types: Cigarettes  . Smokeless tobacco: Never Used  Substance and Sexual Activity  . Alcohol use: No  . Drug use: No  . Sexual activity: Yes    Birth  control/protection: I.U.D.

## 2018-10-31 ENCOUNTER — Encounter: Payer: Self-pay | Admitting: Family Medicine

## 2018-10-31 ENCOUNTER — Telehealth: Payer: Self-pay | Admitting: Radiology

## 2018-10-31 NOTE — Telephone Encounter (Signed)
Note placed at front desk.

## 2018-11-22 ENCOUNTER — Other Ambulatory Visit: Payer: Self-pay | Admitting: Family Medicine

## 2018-11-28 ENCOUNTER — Encounter: Payer: Self-pay | Admitting: Family Medicine

## 2019-04-18 ENCOUNTER — Other Ambulatory Visit: Payer: Self-pay

## 2019-04-18 ENCOUNTER — Ambulatory Visit: Payer: Managed Care, Other (non HMO) | Admitting: Family Medicine

## 2019-04-18 ENCOUNTER — Encounter: Payer: Self-pay | Admitting: Family Medicine

## 2019-04-18 ENCOUNTER — Ambulatory Visit: Payer: Self-pay

## 2019-04-18 DIAGNOSIS — K429 Umbilical hernia without obstruction or gangrene: Secondary | ICD-10-CM

## 2019-04-18 DIAGNOSIS — M533 Sacrococcygeal disorders, not elsewhere classified: Secondary | ICD-10-CM | POA: Diagnosis not present

## 2019-04-18 MED ORDER — CELECOXIB 200 MG PO CAPS: 200.0000 mg | ORAL_CAPSULE | Freq: Two times a day (BID) | ORAL | 6 refills | Status: DC | PRN

## 2019-04-18 MED ORDER — BACLOFEN 10 MG PO TABS: 5.0000 mg | ORAL_TABLET | Freq: Three times a day (TID) | ORAL | 3 refills | Status: AC | PRN

## 2019-04-18 NOTE — Progress Notes (Signed)
   Office Visit Note   Patient: Destiny Rasmussen           Date of Birth: 23-Nov-1985           MRN: [id number] Visit Date: 04/18/2019 Requested by: No referring provider defined for this encounter. PCP: Default, Provider, MD  Subjective: Chief Complaint  Patient presents with  . Lower Back - Pain    Continues to have burning pain in the middle of the lower back off & on. On days she works (stands 9 hr/day), she has the pain constantly. "Little knots" will develop in the lower back. Has cramps in the right thigh 3-4 evenings/week.   . recheck umbilical hernia - ? bigger & now sensitive to touch    HPI: She is here for follow-up chronic right-sided low back pain.  She continues to have.  She continues to have pain which is much worse at the end of her day when working.  She stands for about 9-hour 9 hours per shift.  By the end of the day she is having to lean over on the counter to get some relief.  On her days off her pain is much more manageable because she is able able to switch positions constantly.  No radicular pain.  She tried chiropractic with minimal improvement.  Relafen and Zanaflex helped a little bit.  She is also noticing worsening pain in her umbilical hernia.  She thinks it is getting bigger as well.              ROS:   All other systems were reviewed and are negative.  Objective: Vital Signs: There were no vitals taken for this visit.  Physical Exam:  General:  Alert and oriented, in no acute distress. Pulm:  Breathing unlabored. Psy:  Normal mood, congruent affect.  Low back: She has subcutaneous nodules lateral to the right SI joint which are slightly tender.  She has them on the left as well but they are not tender.  She is moderately tender near the SI joint itself.  No pain of the pain of the sciatic notch, negative straight leg raise, lower extremity strength and reflexes were normal. Abdomen: Her periumbilical hernia is slightly larger, about the size of a  fingertip.  Imaging: None today  Assessment & Plan: 1.  Worsening chronic right-sided back pain -X-rays and MRI scan to further evaluate.  Celebrex and baclofen as needed.  2.  Umbilical hernia -Referral to Dr. Magnus Ivan for consideration of surgical repair.     Procedures: No procedures performed  No notes on file     PMFS History: There are no problems to display for this patient.  Past Medical History:  Diagnosis Date  . No pertinent past medical history     History reviewed. No pertinent family history.  Past Surgical History:  Procedure Laterality Date  . no hisotory of surgeries     Social History   Occupational History  . Not on file  Tobacco Use  . Smoking status: Current Every Day Smoker    Packs/day: 0.50    Types: Cigarettes  . Smokeless tobacco: Never Used  Substance and Sexual Activity  . Alcohol use: No  . Drug use: No  . Sexual activity: Yes    Birth control/protection: I.U.D.

## 2019-04-23 ENCOUNTER — Encounter: Payer: Self-pay | Admitting: Family Medicine

## 2019-04-25 ENCOUNTER — Telehealth: Payer: Self-pay | Admitting: Family Medicine

## 2019-04-25 NOTE — Telephone Encounter (Signed)
Pt called in asking to speak with Terri, I told her she was in clinic and offered to take a message the pt declined and just asked that terri call her back.  [phone]

## 2019-04-26 NOTE — Telephone Encounter (Signed)
I called the patient and advised I have faxed the form to the number on the bottom of the form, [phone], and received confirmation of the fax. She should be receiving an email copy of it, also. Apparently, there were 2 envelopes to be picked up downstairs, 1 with a doctor's note and 1 with the form in it. The patient's daughter only received the 1 with the note in it. That was where the confusion came in - see the patient's MyChart message from 04/25/19.

## 2019-05-10 ENCOUNTER — Other Ambulatory Visit: Payer: Self-pay | Admitting: Surgery

## 2019-05-13 ENCOUNTER — Ambulatory Visit
Admission: RE | Admit: 2019-05-13 | Discharge: 2019-05-13 | Disposition: A | Discharge status: Home / Self Care | Payer: No Typology Code available for payment source | Source: Ambulatory Visit | Attending: Family Medicine | Admitting: Family Medicine

## 2019-05-13 DIAGNOSIS — M533 Sacrococcygeal disorders, not elsewhere classified: Secondary | ICD-10-CM

## 2019-05-14 ENCOUNTER — Telehealth: Payer: Self-pay | Admitting: Family Medicine

## 2019-05-14 NOTE — Telephone Encounter (Signed)
MRI shows disc protrusions at L4-5 and L5-S1.  The L5-S1 disc contacts the S1 nerves, right more than left.

## 2019-08-05 ENCOUNTER — Telehealth: Payer: Self-pay | Admitting: Family Medicine

## 2019-08-05 NOTE — Telephone Encounter (Signed)
Called patient left message to return call concerning mychart message  Referencing MRI and follow-up plan  Of care for her

## 2019-08-07 ENCOUNTER — Encounter: Payer: Self-pay | Admitting: Family Medicine

## 2019-08-07 ENCOUNTER — Other Ambulatory Visit: Payer: Self-pay

## 2019-08-07 ENCOUNTER — Ambulatory Visit (INDEPENDENT_AMBULATORY_CARE_PROVIDER_SITE_OTHER): Payer: No Typology Code available for payment source | Admitting: Family Medicine

## 2019-08-07 DIAGNOSIS — M5126 Other intervertebral disc displacement, lumbar region: Secondary | ICD-10-CM

## 2019-08-07 NOTE — Progress Notes (Signed)
   Office Visit Note   Patient: Destiny Rasmussen           Date of Birth: 1985-10-07           MRN: [id number] Visit Date: 08/07/2019 Requested by: No referring provider defined for this encounter. PCP: Default, Provider, MD  Subjective: Chief Complaint  Patient presents with  . Lower Back - Pain, Follow-up    Cramping and pain down the right leg has increased. Back "locks up" more often. Tried carrying her 35 y/o nephew and walk with him - could not do this very long, as she could do before.    HPI: She is here for follow-up chronic low back and right leg pain.  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  Since last visit she underwent successful abdominal hernia repair per Dr. Magnus Ivan.  She is pleased with her results.  She is back at work on the light duty restrictions I gave her last time.  She continues to have intermittent severe pain, her back "locks up" sometimes and she gets a shooting pain down the back of her leg toward the lateral aspect of her foot.  Denies bowel or bladder dysfunction.  She does not take medications for the pain, she does not want to mask the pain.              ROS:   All other systems were reviewed and are negative.  Objective: Vital Signs: There were no vitals taken for this visit.  Physical Exam:  General:  Alert and oriented, in no acute distress. Pulm:  Breathing unlabored. Psy:  Normal mood, congruent affect. Skin: No rash Low back: She is still slightly tender near the SI joint on the right, but maximum tenderness is in the sciatic notch.  Straight leg raise is positive.  Lower extremity strength and reflexes are still normal and equal.  Imaging: No results found.  Assessment & Plan: 1.  Persistent low back and right leg pain with L5-S1 disc protrusion -We will try a course of physical therapy in [place].  If she fails to improve, she wants to possibly consult with Dr. Otelia Sergeant for surgical  options.  Still could consider an epidural steroid injection, but she feels that this would be more of a temporary treatment rather than a cure.     Procedures: No procedures performed  No notes on file     PMFS History: There are no problems to display for this patient.  Past Medical History:  Diagnosis Date  . No pertinent past medical history     History reviewed. No pertinent family history.  Past Surgical History:  Procedure Laterality Date  . no hisotory of surgeries     Social History   Occupational History  . Not on file  Tobacco Use  . Smoking status: Current Every Day Smoker    Packs/day: 0.50    Types: Cigarettes  . Smokeless tobacco: Never Used  Substance and Sexual Activity  . Alcohol use: No  . Drug use: No  . Sexual activity: Yes    Birth control/protection: I.U.D.

## 2019-08-15 ENCOUNTER — Encounter: Payer: Self-pay | Admitting: Family Medicine

## 2019-09-11 ENCOUNTER — Other Ambulatory Visit: Payer: Self-pay

## 2019-09-11 ENCOUNTER — Ambulatory Visit: Payer: No Typology Code available for payment source | Attending: Family Medicine

## 2019-09-11 DIAGNOSIS — M5441 Lumbago with sciatica, right side: Secondary | ICD-10-CM | POA: Diagnosis not present

## 2019-09-11 DIAGNOSIS — G8929 Other chronic pain: Secondary | ICD-10-CM | POA: Diagnosis present

## 2019-09-11 NOTE — Therapy (Signed)
[place] [facility] PHYSICAL AND SPORTS MEDICINE [address] [place], [address] Phone: [phone]   Fax:  [phone]  Physical Therapy Evaluation  Patient Details  Name: Destiny Rasmussen MRN: [id number] Date of Birth: [birth year] Referring Provider (PT): Lavada Mesi   Encounter Date: 09/11/2019   PT End of Session - 09/11/19 0844    Visit Number 1    Number of Visits 16    Date for PT Re-Evaluation 11/06/19    PT Start Time 0845    PT Stop Time 0945    PT Time Calculation (min) 60 min    Activity Tolerance Patient tolerated treatment well    Behavior During Therapy [facility] for tasks assessed/performed           Past Medical History:  Diagnosis Date   No pertinent past medical history     Past Surgical History:  Procedure Laterality Date   no hisotory of surgeries      There were no vitals filed for this visit.    Subjective Assessment - 09/11/19 0843    Subjective Patient reported her low back pain has been present for about 1 year, about 1.5 years. Eager to decrease her pain.    Pertinent History Patient reported about a year/year and a half of low back pain. Felt relief only with flexion in sitting or resting. Has numbness/eletric shock down R leg, and her leg will go numb, does experience RLE buckling intermittently. Sent for MRI, MRI showed "Small central disc protrusion at L5-S1, contacting both of the descending S1 nerve roots as they course through the lateral recesses, slightly greater on the right. Finding could resultant right lower extremity radicular symptoms. Small central disc protrusion with annular fissure at L4-5 with resultant mild left greater than right lateral recess stenosis." Pt works full time at [employer], job requires a lot of standing/walking.    Limitations Sitting    How long can you sit comfortably? 5-10 minutes    How long can you stand comfortably? 30 minutes - 1 hr    How long can you walk  comfortably? 30 minutes - 1 hr    Diagnostic tests see MRI    Patient Stated Goals to fix her back, fix her pain    Currently in Pain? Yes    Pain Score 5    best: 3/10, worst: 10/10   Pain Location Back    Pain Orientation Right;Lower    Pain Descriptors / Indicators Shooting;Sharp;Numbness;Aching;Stabbing    Pain Type Chronic pain    Pain Radiating Towards posterior R leg, bottom of foot    Pain Onset More than a month ago    Pain Frequency Intermittent    Aggravating Factors  bending, walking, sitting, standing, carrying    Pain Relieving Factors resting    Effect of Pain on Daily Activities impedes daily activities              [facility] PT Assessment - 09/11/19 0001      Assessment   Medical Diagnosis protruded lumbar disc    Referring Provider (PT) Hilts, Michael    Prior Therapy no      Precautions   Precautions None      Restrictions   Weight Bearing Restrictions No      Balance Screen   Has the patient fallen in the past 6 months No    Has the patient had a decrease in activity level because of a fear of falling?  Yes  Is the patient reluctant to leave their home because of a fear of falling?  No      Home Nurse, mental health Private residence    Living Arrangements Spouse/significant other;Children    Available Help at Discharge Family    Type of Home House    Home Access Level entry    Home Layout Two level;Bed/bath upstairs    Alternate Level Stairs-Number of Steps 17    Alternate Level Stairs-Rails Left    Home Equipment None      Prior Function   Level of Independence Independent    Vocation Full time employment    Vocation Requirements standing, walking, bending, lifitng, twisting    Leisure --   customer service     Cognition   Overall Cognitive Status Within Functional Limits for tasks assessed             SUBJECTIVE: Red flags (bowel/bladder changes, saddle paresthesia, personal history of cancer, chills/fever, night  sweats, unrelenting pain, first onset of insidious LBP <20 y/o) Negative   Patient reported about a year/year and a half of low back pain. Felt relief only with flexion in sitting or resting. Has numbness/eletric shock down R leg, and her leg will go numb, does experience RLE buckling intermittently. Sent for MRI, MRI showed "Small central disc protrusion at L5-S1, contacting both of the descending S1 nerve roots as they course through the lateral recesses, slightly greater on the right. Finding could resultant right lower extremity radicular symptoms. Small central disc protrusion with annular fissure at L4-5 with resultant mild left greater than right lateral recess stenosis." Pt works full time at [employer], job requires a lot of standing/walking.  OBJECTIVE  Mental Status Patient is oriented to person, place and time.  Recent memory is intact.  Remote memory is intact.  Attention span and concentration are intact.  Expressive speech is intact.  Patient's fund of knowledge is within normal limits for educational level.  SENSATION: L2-S2: impaired light touch to L5 dermatome on RLE Proprioception and hot/cold testing deferred on this date   MUSCULOSKELETAL: Tremor: None Bulk: Normal Tone: Normal No visible step-off along spinal column  Posture Lumbar lordosis: WNL Iliac crest height: equal bilaterally  Gait WFLs   Palpation TTP of L4, L5, lumbar paraspinals, PSIS (R), glute med (R)   Strength (out of 5) R/L 4-*/5 Hip flexion 4*/4+ Hip ER 4*/4+ Hip IR 4/4+ Hip abduction 4-/4+ Hip adduction 4-*/5 Hip extension 4-*/5 Knee extension 4-*/5 Knee flexion 5/5 Ankle dorsiflexion 5/5 Ankle plantarflexion 5/5 Great toe extension *Indicates pain   AROM (degrees) R/L (all movements include overpressure unless otherwise stated) Lumbar forward flexion:8 inches from floor, painful  Lumbar extension (30): WFL, painful Lumbar lateral flexion (25): WFL BLE Thoracic and Lumbar  rotation (30 degrees):  R: WFL but very painful L: WFL Hip IR (0-45): WFLs, painful on RLE Hip ER (0-45): WFLs, painful on RLE Hip Flexion (0-125): WFLs Hip Abduction (0-40): WFLs Hip extension (0-15): WFLs *Indicates pain   Repeated Movements Flexion: pain increased from 2-3/10, to 4/10 in R low back Extension: pain increased to 5/10 in R low back Repeated lateral flexion needs to be tested next session   Muscle Length Limited hamstring length bilaterally, R greater than L, +thomas test on RLE, mild impairment in obers test in RLE   Passive Accessory Intervertebral Motion (PAIVM) Pt reported reproduction of back pain with CPA L5. Generally hypomobile throughout   SPECIAL TESTS Lumbar Radiculopathy and Discogenic: Slump (  SN 83, -LR 0.32): R: Positive L: Negative SLR (SN 92, -LR 0.29): R: Positive L:  Negative Crossed SLR (SP 90): R: Negative L: Negative  Facet Joint: Extension-Rotation (SN 100, -LR 0.0): R: Positive  Hip: FABER (SN 81): R: Positive for limited ROM L: Negative FADIR (SN 94): R: Positive L: Negative Hip scour (SN 50): R: Positive L: Negative   Functional Tasks Lifting: limited, painful  Squatting: limited, painful  Sit to stand: WFLs but painful  Objective measurements completed on examination: See above findings.   TREATMENT: Access Code: [email or web address] URL: [email or web address] Date: 09/11/2019 Prepared by: Olga Coaster  Pt performed with PT assistance: Exercises Supine hamstring stretch with PT assistance 3x30seconds bilaterally, verbalized understanding of exercise for HEP. Supine Piriformis Stretch with Foot on Ground 3x30seconds bilaterall Supine Lower Trunk Rotation x10  Clinical Impression: The patient is a 34 yo female that presented to PT for evaluation of chronic low back pain with R leg radicular symptoms. Upon assessment the patient demonstrated deficits and reported pain with LE strength testing, hip and lumbar ROM,  tenderness to palpation of lumbar extensors, R glute medius, PSIS, and postural abnormalities. Unclear centralization of symptoms, warrants further assessment.These deficits limit the patient's ability to perform functional activities such as walking, lifting, carrying, bending, squatting, stair navigation, and job activities. The patient would benefit from skilled PT services to address deficits and return to pain-free function at home and work.        PT Education - 09/11/19 0843    Education Details therex, HEP, POC    Person(s) Educated Patient    Methods Explanation;Demonstration;Tactile cues;Verbal cues;Handout    Comprehension Verbalized understanding;Returned demonstration;Verbal cues required;Tactile cues required;Need further instruction            PT Short Term Goals - 09/11/19 1013      PT SHORT TERM GOAL #1   Title Pt will be independent with initial HEP in order to improve strength and decrease back pain in order to improve pain-free function at home and work.    Baseline initial given, and to be added to next session 7/28    Time 4    Period Weeks    Status New    Target Date 10/09/19             PT Long Term Goals - 09/11/19 1014      PT LONG TERM GOAL #1   Title Pt will be independent with HEP in order to improve strength and decrease back pain in order to improve pain-free function at home and work.    Time 8    Period Weeks    Status New    Target Date 11/06/19      PT LONG TERM GOAL #2   Title Pt will improve FOTO score by at least 10 points to indicate an improved ability to perform functional activities.    Baseline FOTO score 65 7/28    Time 8    Period Weeks      PT LONG TERM GOAL #3   Title Pt will decrease worst back pain as reported on NPRS by at least 2 points in order to demonstrate clinically significant reduction in back pain.    Baseline pain at its worst is 10/10 on eval 7/28    Time 8    Period Weeks    Status New    Target Date  11/06/19      PT LONG TERM GOAL #4   Title Pt will increase strength  of by at least 1/2 MMT grade in order to demonstrate improvement in strength and function.    Baseline see eval 7/28    Time 8    Period Weeks    Status New    Target Date 11/06/19      PT LONG TERM GOAL #5   Title The patient will be able to centralize her pain 75% of the time to address radicular symptoms and allow pt to self manage her condition.    Baseline to be further assessed    Time 8    Period Weeks    Status New    Target Date 11/06/19                  Plan - 09/11/19 0844    Clinical Impression Statement The patient is a 34 yo female that presented to PT for evaluation of chronic low back pain with R leg radicular symptoms. Upon assessment the patient demonstrated deficits and reported pain with LE strength testing, hip and lumbar ROM, tenderness to palpation of lumbar extensors, R glute medius, PSIS, and postural abnormalities. Unclear centralization of symptoms, warrants further assessment.These deficits limit the patient's ability to perform functional activities such as walking, lifting, carrying, bending, squatting, stair navigation, and job activities. The patient would benefit from skilled PT services to address deficits and return to pain-free function at home and work.    Personal Factors and Comorbidities Time since onset of injury/illness/exacerbation    Examination-Activity Limitations Sit;Bed Mobility;Sleep;Bend;Squat;Stairs;Caring for Others;Carry;Stand;Dressing;Transfers;Toileting;Lift;Locomotion Level;Reach Overhead    Examination-Participation Restrictions Church;Cleaning;Shop;Community Activity;Driving;Yard Work;Interpersonal Relationship;Laundry;Meal Prep    Stability/Clinical Decision Making Evolving/Moderate complexity    Clinical Decision Making Moderate    Rehab Potential Good    PT Frequency 2x / week    PT Duration 8 weeks    PT Treatment/Interventions Neuromuscular  re-education;Aquatic Therapy;DME Instruction;Biofeedback;Patient/family education;Passive range of motion;Spinal Manipulations;Joint Manipulations;Dry needling;Gait training;Canalith Repostioning;Cryotherapy;Stair training;Energy conservation;Electrical Stimulation;Functional mobility training;Iontophoresis 4mg /ml Dexamethasone;Therapeutic activities;Taping;Splinting;Manual techniques;Therapeutic exercise;Traction;Balance training;Moist Heat    PT Next Visit Plan centralization of symptoms? lateral flexion assessment    PT Home Exercise Plan medbridge: [email or web address]    Consulted and Agree with Plan of Care Patient           Patient will benefit from skilled therapeutic intervention in order to improve the following deficits and impairments:  Decreased endurance, Decreased mobility, Difficulty walking, Hypomobility, Increased muscle spasms, Impaired sensation, Improper body mechanics, Impaired tone, Impaired perceived functional ability, Decreased range of motion, Decreased activity tolerance, Decreased strength, Impaired flexibility, Postural dysfunction, Pain  Visit Diagnosis: Chronic right-sided low back pain with right-sided sciatica     Problem List There are no problems to display for this patient.   Olga Coasteriana Norabelle Kondo PT, DPT 10:24 AM,09/11/19   [place] [facility] [facility] PHYSICAL AND SPORTS MEDICINE [address] [place], [place], [id number] Phone: [phone]   Fax:  [phone]  Name: Nigel BertholdLauren Breaker MRN: [id number] Date of Birth: 1985-08-10

## 2019-09-11 NOTE — Patient Instructions (Signed)
Access Code: [email or web address] URL: [email or web address] Date: 09/11/2019 Prepared by: Olga Coaster  Exercises Supine Hamstring Stretch with Doorway - 1 x daily - 7 x weekly - 1 sets - 3 reps - 30 hold Supine Hamstring Stretch with Strap - 1 x daily - 7 x weekly - 1 sets - 3 reps - 30 hold Supine Piriformis Stretch with Foot on Ground - 1 x daily - 7 x weekly - 1 sets - 3 reps - 30 hold Supine Lower Trunk Rotation - 1 x daily - 7 x weekly - 1 sets - 10 reps

## 2019-09-11 NOTE — Addendum Note (Signed)
Addended by: Fabio Bering on: 09/11/2019 10:26 AM   Modules accepted: Orders

## 2019-09-16 ENCOUNTER — Other Ambulatory Visit: Payer: Self-pay

## 2019-09-16 ENCOUNTER — Ambulatory Visit: Payer: No Typology Code available for payment source | Attending: Family Medicine

## 2019-09-16 DIAGNOSIS — G8929 Other chronic pain: Secondary | ICD-10-CM | POA: Diagnosis present

## 2019-09-16 DIAGNOSIS — M5441 Lumbago with sciatica, right side: Secondary | ICD-10-CM | POA: Insufficient documentation

## 2019-09-16 NOTE — Therapy (Signed)
[place] [facility] PHYSICAL [facility] SPORTS MEDICINE [address], [place], [address] Phone: [phone]   Fax:  [phone]  Physical Therapy Treatment  Patient Details  Name: Destiny Rasmussen MRN: [id number] Date of Birth: [birth year] Referring Provider (PT): Lavada Mesi   Encounter Date: 09/16/2019   PT End of Session - 09/16/19 0909    Visit Number 2    Number of Visits 16    Date for PT Re-Evaluation 11/06/19    PT Start Time 0909    PT Stop Time 0952    PT Time Calculation (min) 43 min    Activity Tolerance Patient tolerated treatment well    Behavior During Therapy [facility] for tasks assessed/performed           Past Medical History:  Diagnosis Date  . No pertinent past medical history     Past Surgical History:  Procedure Laterality Date  . no hisotory of surgeries      There were no vitals filed for this visit.   Subjective Assessment - 09/16/19 0910    Subjective Back is sore. Feels symptoms R low back all the way down to toes (plantar surface). Currently just R low back sorenss (3/10 currently). Gradual onset of back pain about 1-1.5 years.  Work at [employer] which invovles lifting at times.    Pertinent History Patient reported about a year/year and a half of low back pain. Felt relief only with flexion in sitting or resting. Has numbness/eletric shock down R leg, and her leg will go numb, does experience RLE buckling intermittently. Sent for MRI, MRI showed "Small central disc protrusion at L5-S1, contacting both of the descending S1 nerve roots as they course through the lateral recesses, slightly greater on the right. Finding could resultant right lower extremity radicular symptoms. Small central disc protrusion with annular fissure at L4-5 with resultant mild left greater than right lateral recess stenosis." Pt works full time at [employer], job requires a lot of standing/walking.    Limitations Sitting    How long can you sit  comfortably? 5-10 minutes    How long can you stand comfortably? 30 minutes - 1 hr    How long can you walk comfortably? 30 minutes - 1 hr    Diagnostic tests see MRI    Patient Stated Goals to fix her back, fix her pain    Currently in Pain? Yes    Pain Score 3     Pain Onset More than a month ago                                     PT Education - 09/16/19 0929    Education Details ther-ex    Person(s) Educated Patient    Methods Explanation;Demonstration;Tactile cues;Verbal cues    Comprehension Returned demonstration;Verbalized understanding          Objectives  Relieving factors: leaning forward  Therapeutic exercise  Supine hip IR stretch with PT  R LE 30 seconds x 2. Increased pulling the longer the hold time  L LE 30 seconds x 3.   Long sit test (+) for L side suggesting anterior nutation of L innominate  Supine L hip extension isometrics in SKTC position 10x3 with 5 second holds    Supine L hip extension isometrics with PT manual resistance 9x5 seconds with L hip and knee flexed. Increased R low back pull  Supine posterior pelvic tilt 10x5 seconds, then 3x5 seconds. Increased R low back pull  Supine bridge 10x  Improved exercise technique, movement at target joints, use of target muscles after mod verbal, visual, tactile cues.      Manual therapy Supine manual lumbar traction with PT in hooklying. Centralization of symptoms from R posterior hip to central low back, same level of soreness.       Response to treatment/Clinical impression  Slight flexion preference for low back observed. Worked on improving L glute max strength to improve lumbopelvic posture but symptoms increased. Centralizion of symptoms from L posterior hip to central low back with manual lumbar traction in hooklying position. Continue with lumbar manual traction next visit as needed. Pt will benefit from continued skilled physical therapy services to decrease  pain, improve strength and function.       PT Short Term Goals - 09/11/19 1013      PT SHORT TERM GOAL #1   Title Pt will be independent with initial HEP in order to improve strength and decrease back pain in order to improve pain-free function at home and work.    Baseline initial given, and to be added to next session 7/28    Time 4    Period Weeks    Status New    Target Date 10/09/19             PT Long Term Goals - 09/11/19 1014      PT LONG TERM GOAL #1   Title Pt will be independent with HEP in order to improve strength and decrease back pain in order to improve pain-free function at home and work.    Time 8    Period Weeks    Status New    Target Date 11/06/19      PT LONG TERM GOAL #2   Title Pt will improve FOTO score by at least 10 points to indicate an improved ability to perform functional activities.    Baseline FOTO score 65 7/28    Time 8    Period Weeks      PT LONG TERM GOAL #3   Title Pt will decrease worst back pain as reported on NPRS by at least 2 points in order to demonstrate clinically significant reduction in back pain.    Baseline pain at its worst is 10/10 on eval 7/28    Time 8    Period Weeks    Status New    Target Date 11/06/19      PT LONG TERM GOAL #4   Title Pt will increase strength of by at least 1/2 MMT grade in order to demonstrate improvement in strength and function.    Baseline see eval 7/28    Time 8    Period Weeks    Status New    Target Date 11/06/19      PT LONG TERM GOAL #5   Title The patient will be able to centralize her pain 75% of the time to address radicular symptoms and allow pt to self manage her condition.    Baseline to be further assessed    Time 8    Period Weeks    Status New    Target Date 11/06/19                 Plan - 09/16/19 1042    Clinical Impression Statement Slight flexion preference for low back observed. Worked on improving L glute max strength to improve lumbopelvic  posture  but symptoms increased. Centralizion of symptoms from L posterior hip to central low back with manual lumbar traction in hooklying position. Continue with lumbar manual traction next visit as needed. Pt will benefit from continued skilled physical therapy services to decrease pain, improve strength and function.    Personal Factors and Comorbidities Time since onset of injury/illness/exacerbation    Examination-Activity Limitations Sit;Bed Mobility;Sleep;Bend;Squat;Stairs;Caring for Others;Carry;Stand;Dressing;Transfers;Toileting;Lift;Locomotion Level;Reach Overhead    Examination-Participation Restrictions Church;Cleaning;Shop;Community Activity;Driving;Yard Work;Interpersonal Relationship;Laundry;Meal Prep    Stability/Clinical Decision Making Evolving/Moderate complexity    Rehab Potential Good    PT Frequency 2x / week    PT Duration 8 weeks    PT Treatment/Interventions Neuromuscular re-education;Aquatic Therapy;DME Instruction;Biofeedback;Patient/family education;Passive range of motion;Spinal Manipulations;Joint Manipulations;Dry needling;Gait training;Canalith Repostioning;Cryotherapy;Stair training;Energy conservation;Electrical Stimulation;Functional mobility training;Iontophoresis 4mg /ml Dexamethasone;Therapeutic activities;Taping;Splinting;Manual techniques;Therapeutic exercise;Traction;Balance training;Moist Heat    PT Next Visit Plan centralization of symptoms? lateral flexion assessment    PT Home Exercise Plan medbridge: [email or web address]    Consulted and Agree with Plan of Care Patient           Patient will benefit from skilled therapeutic intervention in order to improve the following deficits and impairments:  Decreased endurance, Decreased mobility, Difficulty walking, Hypomobility, Increased muscle spasms, Impaired sensation, Improper body mechanics, Impaired tone, Impaired perceived functional ability, Decreased range of motion, Decreased activity tolerance, Decreased strength,  Impaired flexibility, Postural dysfunction, Pain  Visit Diagnosis: Chronic right-sided low back pain with right-sided sciatica     Problem List There are no problems to display for this patient.   PT, DPT   09/16/2019, 10:49 AM  [place] [facility] PHYSICAL AND SPORTS MEDICINE [address], [place] Phone: [phone]   Fax:  [phone]  Name: Destiny Rasmussen MRN: Nigel Berthold Date of Birth: 06-11-1985

## 2019-09-18 ENCOUNTER — Telehealth: Payer: Self-pay

## 2019-09-18 ENCOUNTER — Ambulatory Visit: Payer: No Typology Code available for payment source

## 2019-09-18 NOTE — Telephone Encounter (Signed)
No show. Called patient who said that she called earlier to cancel because she needed to go back to work. Will be able to make it to her next follow up session.

## 2019-09-23 ENCOUNTER — Encounter: Payer: Self-pay | Admitting: Physical Therapy

## 2019-09-23 ENCOUNTER — Other Ambulatory Visit: Payer: Self-pay

## 2019-09-23 ENCOUNTER — Ambulatory Visit: Payer: No Typology Code available for payment source | Admitting: Physical Therapy

## 2019-09-23 DIAGNOSIS — M5441 Lumbago with sciatica, right side: Secondary | ICD-10-CM

## 2019-09-23 NOTE — Therapy (Signed)
[facility] PHYSICAL AND SPORTS MEDICINE [address], [place], [address] Phone: [phone]   Fax:  [phone]  Physical Therapy Treatment  Patient Details  Name: Destiny Rasmussen MRN: [id number] Date of Birth: 04/08/1985 Referring Provider (PT): Lavada Mesi   Encounter Date: 09/23/2019   PT End of Session - 09/23/19 1806    Visit Number 3    Number of Visits 16    Date for PT Re-Evaluation 11/06/19    PT Start Time 1605    PT Stop Time 1645    PT Time Calculation (min) 40 min    Activity Tolerance Patient tolerated treatment well    Behavior During Therapy [facility] for tasks assessed/performed           Past Medical History:  Diagnosis Date  . No pertinent past medical history     Past Surgical History:  Procedure Laterality Date  . no hisotory of surgeries      There were no vitals filed for this visit.   Subjective Assessment - 09/23/19 1603    Subjective Patient reports she is feeling sore but this is baseline for her. 3-4/10 in the right low back region. She feels no different from previous PT sessions. She had an episode of pain shooting down R leg today with the weakness after prolonged standing. Patient reports she did get some relief with lumbar traction at last visit but then she was sore and miserable at work for a couple of hours.    Pertinent History Patient reported about a year/year and a half of low back pain. Felt relief only with flexion in sitting or resting. Has numbness/eletric shock down R leg, and her leg will go numb, does experience RLE buckling intermittently. Sent for MRI, MRI showed "Small central disc protrusion at L5-S1, contacting both of the descending S1 nerve roots as they course through the lateral recesses, slightly greater on the right. Finding could resultant right lower extremity radicular symptoms. Small central disc protrusion with annular fissure at L4-5 with resultant mild left greater than  right lateral recess stenosis." Pt works full time at [employer], job requires a lot of standing/walking.    Limitations Sitting    How long can you sit comfortably? 5-10 minutes    How long can you stand comfortably? 30 minutes - 1 hr    How long can you walk comfortably? 30 minutes - 1 hr    Diagnostic tests see MRI    Patient Stated Goals to fix her back, fix her pain    Currently in Pain? Yes    Pain Score 3     Pain Location Back    Pain Orientation Right    Pain Onset More than a month ago           TREATMENT:   Therapeutic exercise: to centralize symptoms and improve ROM, strength, muscular endurance, and activity tolerance required for successful completion of functional activities.  - MDT extension in lying (prone press up), x10  Lumbar AROM: - Flexion: = distal shins,  - Extension: = painful, limited compared to prone ROM> . - Rotation: B equal and WFL - Side Flexion: B equal and WFL.  - MDT extension in lying (prone press up) 2x10, feels good, no change in symptoms. No effect.  - MDT extension in lying with clinician overpressure (gentle) x1, produced sharp shooting pain in low back, worse. Discontinued.  - Supine hip IR stretch with PT  R LE 10 seconds x 1. Increased leg pain, so discontinued.   - Supine R hip extension isometrics with PT manual resistance 10x5 seconds with L hip and knee flexed 90 degrees. (incorrectly performed on R side instead of intended L side. Pt reports no increased pain after).   - Supine L hip extension isometrics in SKTC position 10x1 with 5 second holds  - Supine R hip flexion isometric with PT manual resistance 10x5 second. Hip in neutral, knee flexed off end of table.  - Supine L hip extension isometrics in [facility] position with R hip flexion isometric with PT manual resistance, 10x3 with 5 second holds.  - hooklying glute bridge x 10  Cuing for improved glute contraction, lacks full hip extension suggesting poor glute  activation.      PT Education - 09/23/19 1848    Education Details intervention purpose / form    Person(s) Educated Patient    Methods Explanation;Demonstration;Tactile cues;Verbal cues    Comprehension Verbalized understanding;Returned demonstration;Verbal cues required;Tactile cues required;Need further instruction            PT Short Term Goals - 09/11/19 1013      PT SHORT TERM GOAL #1   Title Pt will be independent with initial HEP in order to improve strength and decrease back pain in order to improve pain-free function at home and work.    Baseline initial given, and to be added to next session 7/28    Time 4    Period Weeks    Status New    Target Date 10/09/19             PT Long Term Goals - 09/11/19 1014      PT LONG TERM GOAL #1   Title Pt will be independent with HEP in order to improve strength and decrease back pain in order to improve pain-free function at home and work.    Time 8    Period Weeks    Status New    Target Date 11/06/19      PT LONG TERM GOAL #2   Title Pt will improve FOTO score by at least 10 points to indicate an improved ability to perform functional activities.    Baseline FOTO score 65 7/28    Time 8    Period Weeks      PT LONG TERM GOAL #3   Title Pt will decrease worst back pain as reported on NPRS by at least 2 points in order to demonstrate clinically significant reduction in back pain.    Baseline pain at its worst is 10/10 on eval 7/28    Time 8    Period Weeks    Status New    Target Date 11/06/19      PT LONG TERM GOAL #4   Title Pt will increase strength of by at least 1/2 MMT grade in order to demonstrate improvement in strength and function.    Baseline see eval 7/28    Time 8    Period Weeks    Status New    Target Date 11/06/19      PT LONG TERM GOAL #5   Title The patient will be able to centralize her pain 75% of the time to address radicular symptoms and allow pt to self manage her condition.     Baseline to be further assessed    Time 8    Period Weeks    Status New    Target Date 11/06/19  Plan - 09/23/19 1848    Clinical Impression Statement Patient tolerated treatment well overall and reported no change in pain by end of session. Continued to explore lumbar extension in unloaded position which felt good at first but now lasting decrease in pain. Discontinued progression after increased symptoms with slight overpressure. Continued to use isometric techniques to address possibly posteriorly rotated innominate identified at last session. Patient tolerated this well. Patient also noted for decreased glute activation and control during bridge and would likely benefit from further strengthening of the hips and glutes. Overall no change in pain by end of session. Did not continue with traction due to increased pain following last treatment session.  Patient would benefit from continued management of limiting condition by skilled physical therapist to address remaining impairments and functional limitations to work towards stated goals and return to PLOF or maximal functional independence.    Personal Factors and Comorbidities Time since onset of injury/illness/exacerbation    Examination-Activity Limitations Sit;Bed Mobility;Sleep;Bend;Squat;Stairs;Caring for Others;Carry;Stand;Dressing;Transfers;Toileting;Lift;Locomotion Level;Reach Overhead    Examination-Participation Restrictions Church;Cleaning;Shop;Community Activity;Driving;Yard Work;Interpersonal Relationship;Laundry;Meal Prep    Stability/Clinical Decision Making Evolving/Moderate complexity    Rehab Potential Good    PT Frequency 2x / week    PT Duration 8 weeks    PT Treatment/Interventions Neuromuscular re-education;Aquatic Therapy;DME Instruction;Biofeedback;Patient/family education;Passive range of motion;Spinal Manipulations;Joint Manipulations;Dry needling;Gait training;Canalith  Repostioning;Cryotherapy;Stair training;Energy conservation;Electrical Stimulation;Functional mobility training;Iontophoresis 4mg /ml Dexamethasone;Therapeutic activities;Taping;Splinting;Manual techniques;Therapeutic exercise;Traction;Balance training;Moist Heat    PT Next Visit Plan centralization of symptoms? lateral flexion assessment    PT Home Exercise Plan medbridge: [email or web address]    Consulted and Agree with Plan of Care Patient           Patient will benefit from skilled therapeutic intervention in order to improve the following deficits and impairments:  Decreased endurance, Decreased mobility, Difficulty walking, Hypomobility, Increased muscle spasms, Impaired sensation, Improper body mechanics, Impaired tone, Impaired perceived functional ability, Decreased range of motion, Decreased activity tolerance, Decreased strength, Impaired flexibility, Postural dysfunction, Pain  Visit Diagnosis: Chronic right-sided low back pain with right-sided sciatica     Problem List There are no problems to display for this patient.   . Luretha Murphy, PT, DPT 09/23/19, 6:50 PM  [facility] PHYSICAL AND SPORTS MEDICINE [address], [place] Phone: [phone]   Fax:  [phone]  Name: Joletta Manner MRN: Nigel Berthold Date of Birth: Jun 07, 1985

## 2019-09-25 ENCOUNTER — Ambulatory Visit: Payer: No Typology Code available for payment source

## 2019-09-25 ENCOUNTER — Other Ambulatory Visit: Payer: Self-pay

## 2019-09-25 DIAGNOSIS — M5441 Lumbago with sciatica, right side: Secondary | ICD-10-CM | POA: Diagnosis not present

## 2019-09-25 DIAGNOSIS — G8929 Other chronic pain: Secondary | ICD-10-CM

## 2019-09-25 NOTE — Therapy (Signed)
[facility] PHYSICAL [facility] SPORTS MEDICINE [address] [place], [address] Phone: [phone]   Fax:  [phone]  Physical Therapy Treatment  Patient Details  Name: Destiny Rasmussen MRN: [id number] Date of Birth: 1985-03-19 Referring Provider (PT): Lavada Mesi   Encounter Date: 09/25/2019   PT End of Session - 09/25/19 1527    Visit Number 4    Number of Visits 16    Date for PT Re-Evaluation 11/06/19    PT Start Time 1517    PT Stop Time 1555    PT Time Calculation (min) 38 min    Activity Tolerance Patient tolerated treatment well;No increased pain;Patient limited by fatigue    Behavior During Therapy [facility] for tasks assessed/performed           Past Medical History:  Diagnosis Date  . No pertinent past medical history     Past Surgical History:  Procedure Laterality Date  . no hisotory of surgeries      There were no vitals filed for this visit.   Subjective Assessment - 09/25/19 1521    Subjective Pt reports no major changes thus far. Still having difficulty with prolonged standing. Pt reports to be compliant with HEP, that it is helpful and nonaggravating, however has no lasting effects.    Pertinent History Patient reported about a year/year and a half of low back pain. Felt relief only with flexion in sitting or resting. Has numbness/eletric shock down R leg, and her leg will go numb, does experience RLE buckling intermittently. Sent for MRI, MRI showed "Small central disc protrusion at L5-S1, contacting both of the descending S1 nerve roots as they course through the lateral recesses, slightly greater on the right. Finding could resultant right lower extremity radicular symptoms. Small central disc protrusion with annular fissure at L4-5 with resultant mild left greater than right lateral recess stenosis." Pt works full time at [employer], job requires a lot of standing/walking.    Limitations Sitting    How long can you  sit comfortably? 5-10 minutes    How long can you stand comfortably? 30 minutes - 1 hr    How long can you walk comfortably? 30 minutes - 1 hr    Diagnostic tests see MRI    Patient Stated Goals to fix her back, fix her pain    Currently in Pain? Yes    Pain Score 3     Pain Location Back    Pain Orientation Right           INTERVENTION THIS DATE:  -Supine, legs elevated hip extension isometrics (lumbar self traction) 15x5secH -Double knee to chest repeated lumbar flexion oscillation mobilization 2x20; knees are shoulder width to avoid aggravation of FAI -Sahrmann core stabilization Leg drops 1x8 bilat (unable to account for lumbopelvic movement) -hooklying c [place] biocuff pelvic rocking 20x  -Sahrmann core stabilization c Biocuff leg drops 1x20 alternating sides (improved performance)  -standing c buttocks/shoulders against wall, biocuff in lordosis, pelvic rocking x20      PT Short Term Goals - 09/11/19 1013      PT SHORT TERM GOAL #1   Title Pt will be independent with initial HEP in order to improve strength and decrease back pain in order to improve pain-free function at home and work.    Baseline initial given, and to be added to next session 7/28    Time 4    Period Weeks    Status New    Target  Date 10/09/19             PT Long Term Goals - 09/11/19 1014      PT LONG TERM GOAL #1   Title Pt will be independent with HEP in order to improve strength and decrease back pain in order to improve pain-free function at home and work.    Time 8    Period Weeks    Status New    Target Date 11/06/19      PT LONG TERM GOAL #2   Title Pt will improve FOTO score by at least 10 points to indicate an improved ability to perform functional activities.    Baseline FOTO score 65 7/28    Time 8    Period Weeks      PT LONG TERM GOAL #3   Title Pt will decrease worst back pain as reported on NPRS by at least 2 points in order to demonstrate clinically significant  reduction in back pain.    Baseline pain at its worst is 10/10 on eval 7/28    Time 8    Period Weeks    Status New    Target Date 11/06/19      PT LONG TERM GOAL #4   Title Pt will increase strength of by at least 1/2 MMT grade in order to demonstrate improvement in strength and function.    Baseline see eval 7/28    Time 8    Period Weeks    Status New    Target Date 11/06/19      PT LONG TERM GOAL #5   Title The patient will be able to centralize her pain 75% of the time to address radicular symptoms and allow pt to self manage her condition.    Baseline to be further assessed    Time 8    Period Weeks    Status New    Target Date 11/06/19                 Plan - 09/25/19 1528    Clinical Impression Statement Pt able to complete entire session as planned with rest breaks provided as needed. Pt maintains high level of focus and motivation. Extensive verbal, visual, and tactile cues are provided for most accurate form possible. Pt educated on lumbopelvic stabilization to reduce passive end range lordosis in prolonged standing. Pt has difficulty with proprioception in session, but is better able to perform with use of biofeedback cuff. Pelvic rocking performed static and dynamic in sitting/standing. Author provides minA intermittently for full ROM when needed. Overall pt continues to make steady progress toward treatment goals.    Personal Factors and Comorbidities Time since onset of injury/illness/exacerbation    Examination-Activity Limitations Sit;Bed Mobility;Sleep;Bend;Squat;Stairs;Caring for Others;Carry;Stand;Dressing;Transfers;Toileting;Lift;Locomotion Level;Reach Overhead    Examination-Participation Restrictions Church;Cleaning;Shop;Community Activity;Driving;Yard Work;Interpersonal Relationship;Laundry;Meal Prep    Stability/Clinical Decision Making Evolving/Moderate complexity    Clinical Decision Making Moderate    Rehab Potential Good    PT Frequency 2x / week     PT Duration 8 weeks    PT Treatment/Interventions Neuromuscular re-education;Aquatic Therapy;DME Instruction;Biofeedback;Patient/family education;Passive range of motion;Spinal Manipulations;Joint Manipulations;Dry needling;Gait training;Canalith Repostioning;Cryotherapy;Stair training;Energy conservation;Electrical Stimulation;Functional mobility training;Iontophoresis 4mg /ml Dexamethasone;Therapeutic activities;Taping;Splinting;Manual techniques;Therapeutic exercise;Traction;Balance training;Moist Heat    PT Next Visit Plan centralization of symptoms? 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  3:56 PM, 09/25/19 Rosamaria Lints, PT, DPT Physical Therapist - [place] [phone] (Office)    Milderd Manocchio C 09/25/2019, 3:43 PM  [place] [facility] PHYSICAL AND SPORTS MEDICINE [address], [place], [address] Phone: [phone]   Fax:  [phone]  Name: Destiny Rasmussen MRN: [id number] Date of Birth: [birth year]

## 2019-09-30 ENCOUNTER — Ambulatory Visit: Payer: No Typology Code available for payment source

## 2019-10-02 ENCOUNTER — Ambulatory Visit: Payer: No Typology Code available for payment source

## 2019-10-07 ENCOUNTER — Ambulatory Visit: Payer: No Typology Code available for payment source

## 2019-10-08 ENCOUNTER — Other Ambulatory Visit: Payer: Self-pay

## 2019-10-08 ENCOUNTER — Ambulatory Visit: Payer: No Typology Code available for payment source

## 2019-10-08 DIAGNOSIS — G8929 Other chronic pain: Secondary | ICD-10-CM

## 2019-10-08 DIAGNOSIS — M5441 Lumbago with sciatica, right side: Secondary | ICD-10-CM | POA: Diagnosis not present

## 2019-10-08 NOTE — Therapy (Signed)
[facility] PHYSICAL AND SPORTS MEDICINE [address] [place], [address] Phone: [phone]   Fax:  [phone]  Physical Therapy Treatment  Patient Details  Name: Destiny Rasmussen MRN: [id number] Date of Birth: 11-01-1985 Referring Provider (PT): Lavada Mesi   Encounter Date: 10/08/2019   PT End of Session - 10/08/19 1419    Visit Number 5    Number of Visits 16    Date for PT Re-Evaluation 11/06/19    PT Start Time 1419    PT Stop Time 1510    PT Time Calculation (min) 51 min    Activity Tolerance Patient tolerated treatment well;No increased pain;Patient limited by fatigue    Behavior During Therapy [facility] for tasks assessed/performed           Past Medical History:  Diagnosis Date  . No pertinent past medical history     Past Surgical History:  Procedure Laterality Date  . no hisotory of surgeries      There were no vitals filed for this visit.   Subjective Assessment - 10/08/19 1420    Subjective Back is sore. R low back pain (R PSIS) 3/10 currently. No R LE radiating symptoms currently. Had R LE radiating symptoms a few days ago. No difference in pain after last session.    Pertinent History Patient reported about a year/year and a half of low back pain. Felt relief only with flexion in sitting or resting. Has numbness/eletric shock down R leg, and her leg will go numb, does experience RLE buckling intermittently. Sent for MRI, MRI showed "Small central disc protrusion at L5-S1, contacting both of the descending S1 nerve roots as they course through the lateral recesses, slightly greater on the right. Finding could resultant right lower extremity radicular symptoms. Small central disc protrusion with annular fissure at L4-5 with resultant mild left greater than right lateral recess stenosis." Pt works full time at [employer], job requires a lot of standing/walking.    Limitations Sitting    How long can you sit comfortably?  5-10 minutes    How long can you stand comfortably? 30 minutes - 1 hr    How long can you walk comfortably? 30 minutes - 1 hr    Diagnostic tests see MRI    Patient Stated Goals to fix her back, fix her pain    Currently in Pain? Yes    Pain Score 3    R PSIS                                    PT Education - 10/08/19 1500    Education Details ther-ex, HEP    Person(s) Educated Patient    Methods Explanation;Demonstration;Tactile cues;Verbal cues;Handout    Comprehension Returned demonstration;Verbalized understanding          Objectives  Access Code: [email or web address]  Observation Relieving factors: leaning forward with sciatic symptoms leaning back with radicular/L4 symptoms.      Therapeutic exercise  seated trunk flexion stretch 10x2 with 5 seconds   Prone glute max set 10x3 with 5 second holds each LE  hooklyng transversus abdominis contraction 10x5 seconds for 2 sets   Then hip fallouts 10x each LE  Pt tendency for L pelvic rotation during L hip fallout.  Work on R pelvic rotation strength.    Increased time secondary emphasis for proper technique.    R L4 dermatome tinging in  sitting afterwards. Decreases with upright posture  Sitting with lumbar towel roll  Decreased R L4 radicular symptoms.       Work on hip hinging and R pelvic rotation strength next visit if appropriate   Improved exercise technique, movement at target joints, use of target muscles after mod verbal, visual, tactile cues.      Manual therapy Prone R UPA to L 5 TP grade 1+ to 3- for pain control.   Prone L UPA L 5, L4, L3, L2 grade 1+ to 3-   No change in symptoms. Good mobility palpated.      Response to treatment/Clinical impression Pt demonstrates decreased trunk and pelvic control observed during movement. Good lumbar mobility palpated with TTP R L4, L5, PSIS area. R L4 dermatome radicular symptoms observed during treatment which decreased  with gentle trunk extension/upright posture suggesting extension preference for radicular symptoms. Pt might demonstrate flexion preference for R sciatic symptoms based on previous sessions. Pt also demonstrates decreased trunk control with L hip fallout and may benefit from improving R pelvic rotation strength to promote stability and control of low back and pelvis. Pt tolerated session well without aggravation of symptoms. Pt will benefit from continued skilled physical therapy services to decrease pain, improve strength and function.       PT Short Term Goals - 09/11/19 1013      PT SHORT TERM GOAL #1   Title Pt will be independent with initial HEP in order to improve strength and decrease back pain in order to improve pain-free function at home and work.    Baseline initial given, and to be added to next session 7/28    Time 4    Period Weeks    Status New    Target Date 10/09/19             PT Long Term Goals - 09/11/19 1014      PT LONG TERM GOAL #1   Title Pt will be independent with HEP in order to improve strength and decrease back pain in order to improve pain-free function at home and work.    Time 8    Period Weeks    Status New    Target Date 11/06/19      PT LONG TERM GOAL #2   Title Pt will improve FOTO score by at least 10 points to indicate an improved ability to perform functional activities.    Baseline FOTO score 65 7/28    Time 8    Period Weeks      PT LONG TERM GOAL #3   Title Pt will decrease worst back pain as reported on NPRS by at least 2 points in order to demonstrate clinically significant reduction in back pain.    Baseline pain at its worst is 10/10 on eval 7/28    Time 8    Period Weeks    Status New    Target Date 11/06/19      PT LONG TERM GOAL #4   Title Pt will increase strength of by at least 1/2 MMT grade in order to demonstrate improvement in strength and function.    Baseline see eval 7/28    Time 8    Period Weeks    Status New      Target Date 11/06/19      PT LONG TERM GOAL #5   Title The patient will be able to centralize her pain 75% of the time to address radicular symptoms and allow  pt to self manage her condition.    Baseline to be further assessed    Time 8    Period Weeks    Status New    Target Date 11/06/19                 Plan - 10/08/19 1417    Clinical Impression Statement Pt demonstrates decreased trunk and pelvic control observed during movement. Good lumbar mobility palpated with TTP R L4, L5, PSIS area. R L4 dermatome radicular symptoms observed during treatment which decreased with gentle trunk extension/upright posture suggesting extension preference for radicular symptoms. Pt might demonstrate flexion preference for R sciatic symptoms based on previous sessions. Pt also demonstrates decreased trunk control with L hip fallout and may benefit from improving R pelvic rotation strength to promote stability and control of low back and pelvis. Pt tolerated session well without aggravation of symptoms. Pt will benefit from continued skilled physical therapy services to decrease pain, improve strength and function.    Personal Factors and Comorbidities Time since onset of injury/illness/exacerbation    Examination-Activity Limitations Sit;Bed Mobility;Sleep;Bend;Squat;Stairs;Caring for Others;Carry;Stand;Dressing;Transfers;Toileting;Lift;Locomotion Level;Reach Overhead    Examination-Participation Restrictions Church;Cleaning;Shop;Community Activity;Driving;Yard Work;Interpersonal Relationship;Laundry;Meal Prep    Stability/Clinical Decision Making Evolving/Moderate complexity    Rehab Potential Good    PT Frequency 2x / week    PT Duration 8 weeks    PT Treatment/Interventions Neuromuscular re-education;Aquatic Therapy;DME Instruction;Biofeedback;Patient/family education;Passive range of motion;Spinal Manipulations;Joint Manipulations;Dry needling;Gait training;Canalith Repostioning;Cryotherapy;Stair  training;Energy conservation;Electrical Stimulation;Functional mobility training;Iontophoresis 4mg /ml Dexamethasone;Therapeutic activities;Taping;Splinting;Manual techniques;Therapeutic exercise;Traction;Balance training;Moist Heat    PT Next Visit Plan centralization of symptoms? lateral flexion assessment    PT Home Exercise Plan medbridge: [email or web address]    Consulted and Agree with Plan of Care Patient           Patient will benefit from skilled therapeutic intervention in order to improve the following deficits and impairments:  Decreased endurance, Decreased mobility, Difficulty walking, Hypomobility, Increased muscle spasms, Impaired sensation, Improper body mechanics, Impaired tone, Impaired perceived functional ability, Decreased range of motion, Decreased activity tolerance, Decreased strength, Impaired flexibility, Postural dysfunction, Pain  Visit Diagnosis: Chronic right-sided low back pain with right-sided sciatica      Problem List There are no problems to display for this patient.   PT, DPT   10/08/2019, 3:35 PM  [facility] PHYSICAL AND SPORTS MEDICINE [address], [place] Phone: [phone]   Fax:  [phone]  Name: Destiny Rasmussen MRN: Nigel Berthold Date of Birth: 19-Jun-1985

## 2019-10-08 NOTE — Patient Instructions (Addendum)
  Access Code: [email or web address] URL: [email or web address] Date: 10/08/2019 Prepared by: Loralyn Freshwater  Exercises Supine Hamstring Stretch with Doorway - 1 x daily - 7 x weekly - 1 sets - 3 reps - 30 hold Supine Hamstring Stretch with Strap - 1 x daily - 7 x weekly - 1 sets - 3 reps - 30 hold Supine Piriformis Stretch with Foot on Ground - 1 x daily - 7 x weekly - 1 sets - 3 reps - 30 hold Supine Lower Trunk Rotation - 1 x daily - 7 x weekly - 1 sets - 10 reps Prone Quadriceps Set - 1 x daily - 7 x weekly - 3 sets - 10 reps - 5 seconds hold Supine Transversus Abdominis Bracing - Hands on Stomach - 1 x daily - 7 x weekly - 3 sets - 10 reps - 5 secons hold Bent Knee Fallouts - 1 x daily - 7 x weekly - 3 sets - 10 reps

## 2019-10-10 ENCOUNTER — Ambulatory Visit: Payer: No Typology Code available for payment source

## 2019-10-14 ENCOUNTER — Ambulatory Visit: Payer: No Typology Code available for payment source

## 2019-10-16 ENCOUNTER — Ambulatory Visit: Payer: No Typology Code available for payment source

## 2019-10-23 ENCOUNTER — Ambulatory Visit: Payer: No Typology Code available for payment source | Attending: Family Medicine

## 2019-10-29 ENCOUNTER — Telehealth: Payer: Self-pay

## 2019-10-29 ENCOUNTER — Ambulatory Visit: Payer: No Typology Code available for payment source

## 2019-10-29 NOTE — Telephone Encounter (Signed)
No show. Called pt who said that she had a severe sinus and ear infection. Also had to get her daughter tested for covid and is awaiting test results. Back pain has its good days and bad days. Bothers her a lot at work. 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 Will try to make it to her next appointment depending on the test results.

## 2019-11-05 ENCOUNTER — Ambulatory Visit: Payer: No Typology Code available for payment source

## 2019-11-12 ENCOUNTER — Ambulatory Visit: Payer: No Typology Code available for payment source

## 2019-11-12 ENCOUNTER — Other Ambulatory Visit: Payer: Self-pay

## 2019-11-14 ENCOUNTER — Ambulatory Visit (INDEPENDENT_AMBULATORY_CARE_PROVIDER_SITE_OTHER): Payer: No Typology Code available for payment source | Admitting: Surgery

## 2019-11-14 ENCOUNTER — Other Ambulatory Visit: Payer: Self-pay

## 2019-11-14 DIAGNOSIS — M5126 Other intervertebral disc displacement, lumbar region: Secondary | ICD-10-CM

## 2019-11-14 DIAGNOSIS — M5416 Radiculopathy, lumbar region: Secondary | ICD-10-CM

## 2019-11-18 ENCOUNTER — Encounter: Payer: Self-pay | Admitting: Surgery

## 2019-11-18 NOTE — Progress Notes (Signed)
Office Visit Note   Patient: Destiny Rasmussen           Date of Birth: [birth year]           MRN: [id number] Visit Date: 11/14/2019              Requested by: No referring provider defined for this encounter. PCP: Default, Provider, MD   Assessment & Plan: Visit Diagnoses:  1. Protruded lumbar disc   2. Radiculopathy, lumbar region     Plan: I did review patient's previous lumbar MRI report with her today.  I do not think that she is symptomatic enough to warrant surgical intervention at this time.  Ultimately Dr. Otelia Sergeant will make that final decision as to whether or not this is truly the case.  I do think it is reasonable to refer her to Dr. Alvester Morin for right versus bilateral L5-S1 transforaminal ESI's.  At her worst she is more symptomatic with the right leg.  I will see what Dr. Alvester Morin thanks.  Patient will need follow-up visit with Dr. Otelia Sergeant 1 to 2 weeks after she has had the injections to see how she is doing and discuss further treatment options.  Patient has not seen Dr. Otelia Sergeant in the past but states that he performed surgery on her husband.  Follow-Up Instructions: Return in about 4 weeks (around 12/12/2019) for with dr Otelia Sergeant for recheck after lumbar ESI.   Orders:  Orders Placed This Encounter  Procedures  . Ambulatory referral to Physical Medicine Rehab   No orders of the defined types were placed in this encounter.     Procedures: No procedures performed   Clinical Data: No additional findings.   Subjective: Chief Complaint  Patient presents with  . Lower Back - Pain    HPI 34 year old white female comes in for evaluation of low back pain, right greater than left lower extremity radiculopathy.  Patient has been followed by Dr. Casimiro Needle hilts for this problem.  She did have lumbar MRI scan performed May 13, 2019 and that showed:   EXAM: MRI LUMBAR SPINE WITHOUT CONTRAST  TECHNIQUE: Multiplanar, multisequence MR imaging of the lumbar spine was performed. No  intravenous contrast was administered.  COMPARISON:  Prior radiograph from 04/18/2019.  FINDINGS: Segmentation: Standard. Lowest well-formed disc space labeled the L5-S1 level.  Alignment: Physiologic with preservation of the normal lumbar lordosis.  Vertebrae: Vertebral body height maintained without evidence for acute or chronic fracture. Bone marrow signal intensity within normal limits. No discrete or worrisome osseous lesions. No abnormal marrow edema.  Conus medullaris and cauda equina: Conus extends to the L1 level. Conus and cauda equina appear normal.  Paraspinal and other soft tissues: Unremarkable.  Disc levels:  T11-12: Disc desiccation with intervertebral disc space narrowing with small chronic endplate Schmorl's nodes. Minimal disc bulge. No stenosis.  T12-L1: Unremarkable.  L1-2:  Unremarkable.  L2-3:  Unremarkable.  L3-4: Minimal annular disc bulge. Mild facet hypertrophy. No significant spinal stenosis. Foramina remain patent.  L4-5: Diffuse disc bulge with disc desiccation. Superimposed small central disc protrusion with associated annular fissure. Mild facet hypertrophy. Resultant mild canal with mild left greater than right lateral recess stenosis. Foramina remain patent.  L5-S1: Chronic intervertebral disc space narrowing with diffuse disc bulge and disc desiccation. Superimposed small central disc protrusion contacts the descending S1 nerve roots as they course through the lateral recesses, slightly more prominent on the right. Mild bilateral lateral recess stenosis. Central canal remains patent. No foraminal narrowing.  IMPRESSION: 1. Small  central disc protrusion at L5-S1, contacting both of the descending S1 nerve roots as they course through the lateral recesses, slightly greater on the right. Finding could resultant right lower extremity radicular symptoms. 2. Small central disc protrusion with annular fissure at L4-5  with resultant mild left greater than right lateral recess stenosis.   Electronically Signed   By: Rise Mu M.D.   On: 05/13/2019 21:56  For treatment patient has tried oral NSAIDs, muscle laxer's, formal PT, activity modification.  She describes low back pain with currently about 2-3 episodes of a sharp pain that radiates down her right leg to her foot.  These episodes do not last long.  In the left lower extremity she has intermittent episodes of her leg "twitching".. Deftly states that the right leg is worse.  Chronic issues do not limit her from her activities.  Patient is employed at [employer] and works 40 hours/week where she does a lot of standing and lifts up to 70 pounds.  Patient is married and has a young son that she is very active with.  No complaints of bowel or bladder incontinence.    Review of Systems No current cardiac pulmonary GI GU issues  Objective: Vital Signs: There were no vitals taken for this visit.  Physical Exam Constitutional:      General: She is not in acute distress.    Appearance: Normal appearance.  HENT:     Head: Normocephalic and atraumatic.  Eyes:     Extraocular Movements: Extraocular movements intact.     Pupils: Pupils are equal, round, and reactive to light.  Pulmonary:     Effort: Pulmonary effort is normal. No respiratory distress.  Musculoskeletal:     Comments: Gait is normal.  Patient has good lumbar extension and flexion with hands down to her ankles.  No pain with these maneuvers.  No lumbar paraspinal tenderness.  Negative logroll bilateral hips.  Negative straight leg raise.  Neurovascular intact.  Bilateral calves are nontender.  Skin:    General: Skin is warm and dry.  Neurological:     General: No focal deficit present.     Mental Status: She is alert and oriented to person, place, and time.  Psychiatric:        Mood and Affect: Mood normal.     Ortho Exam  Specialty Comments:  No specialty comments  available.  Imaging: No results found.   PMFS History: There are no problems to display for this patient.  Past Medical History:  Diagnosis Date  . No pertinent past medical history     No family history on file.  Past Surgical History:  Procedure Laterality Date  . no hisotory of surgeries     Social History   Occupational History  . Not on file  Tobacco Use  . Smoking status: Current Every Day Smoker    Packs/day: 0.50    Types: Cigarettes  . Smokeless tobacco: Never Used  Substance and Sexual Activity  . Alcohol use: No  . Drug use: No  . Sexual activity: Yes    Birth control/protection: I.U.D.

## 2019-11-25 ENCOUNTER — Telehealth: Payer: Self-pay

## 2019-11-25 NOTE — Telephone Encounter (Signed)
Patient called in saying she still not received any call about sch for epidural injection, 2 injctions : 1 on left 1 on right

## 2019-11-25 NOTE — Telephone Encounter (Signed)
I called and lmom for pt that we are waiting on her insurance to approve the procedure and as soon as we get the approval she would be getting a call to get scheduled

## 2019-11-28 ENCOUNTER — Telehealth: Payer: Self-pay

## 2019-11-28 NOTE — Telephone Encounter (Signed)
Patient called in returning missed call. Say it is best to call her after 2:30pm

## 2019-12-02 NOTE — Telephone Encounter (Signed)
See below. Patient was calling back about getting scheduled for ESI.

## 2019-12-03 NOTE — Telephone Encounter (Signed)
Pt called bc and sch 11/9 and request for Valium for the appt.

## 2019-12-04 ENCOUNTER — Other Ambulatory Visit: Payer: Self-pay | Admitting: Physical Medicine and Rehabilitation

## 2019-12-04 DIAGNOSIS — F411 Generalized anxiety disorder: Secondary | ICD-10-CM

## 2019-12-04 MED ORDER — DIAZEPAM 5 MG PO TABS
ORAL_TABLET | ORAL | 0 refills | Status: DC
Start: 2019-12-04 — End: 2021-08-08

## 2019-12-04 NOTE — Telephone Encounter (Signed)
done

## 2019-12-04 NOTE — Progress Notes (Signed)
Pre-procedure diazepam ordered for pre-operative anxiety.  

## 2019-12-23 ENCOUNTER — Telehealth: Payer: Self-pay | Admitting: Physical Medicine and Rehabilitation

## 2019-12-23 NOTE — Telephone Encounter (Signed)
Called pt and lvm #1 

## 2019-12-23 NOTE — Telephone Encounter (Signed)
Patient called. Says she will be changing insurance. At this time she does not have coverage. She will call back when she does. Would like to cxl appointment with Dr. Alvester Morin.

## 2019-12-24 ENCOUNTER — Ambulatory Visit: Payer: No Typology Code available for payment source | Admitting: Physical Medicine and Rehabilitation

## 2019-12-25 ENCOUNTER — Ambulatory Visit: Payer: No Typology Code available for payment source | Admitting: Specialist

## 2021-08-08 ENCOUNTER — Telehealth: Payer: BLUE CROSS/BLUE SHIELD | Admitting: Family

## 2021-08-08 DIAGNOSIS — J019 Acute sinusitis, unspecified: Secondary | ICD-10-CM

## 2021-08-08 MED ORDER — AMOXICILLIN-POT CLAVULANATE 875-125 MG PO TABS: 1.0000 | ORAL_TABLET | Freq: Two times a day (BID) | ORAL | 0 refills | Status: AC

## 2021-09-02 IMAGING — MR MR LUMBAR SPINE W/O CM
5 series · 42 of 48 positions shown · non-contrast
Comparison: Prior radiograph from 04/18/2019.

CLINICAL DATA: Initial evaluation for chronic right lower back pain
with right lower extremity numbness.

EXAM:
MRI LUMBAR SPINE WITHOUT CONTRAST
TECHNIQUE: Multiplanar, multisequence MR imaging of the lumbar spine was
performed. No intravenous contrast was administered.

[Series 3: T2 post-contrast · sagittal · 4.0mm · 0.88mm/px · 6 of 12 slices shown]
[im 1/12]
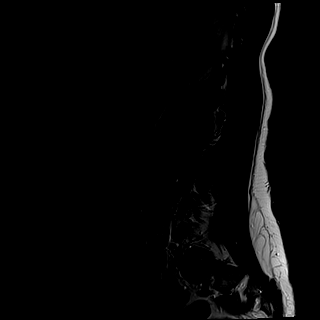
[im 3/12]
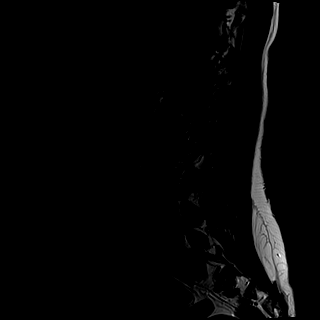
[im 5/12]
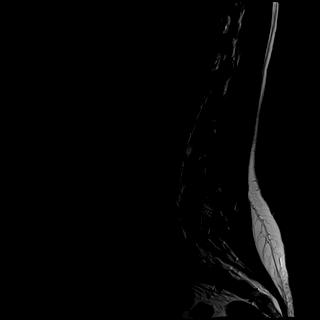
[im 7/12]
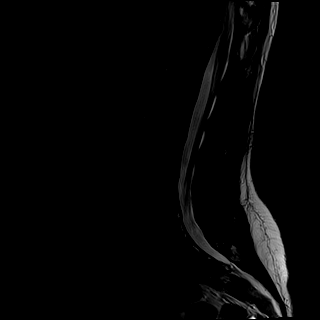
[im 9/12]
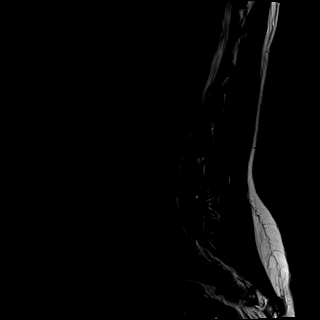
[im 12/12]
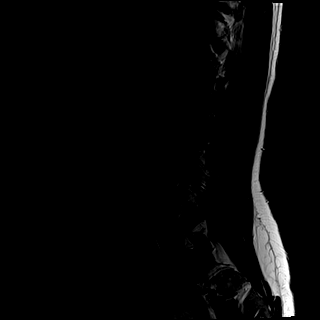

[Series 4: T1 · sagittal · 4.0mm · 0.88mm/px · 6 of 12 slices shown (1 of 2)]
[im 1/12]
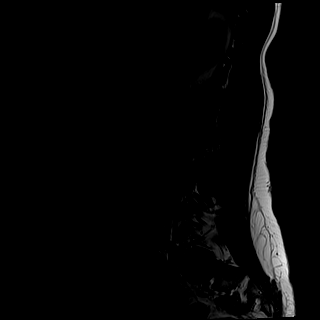
[im 3/12]
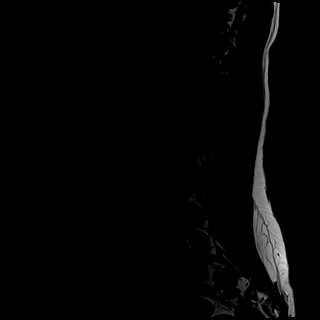
[im 5/12]
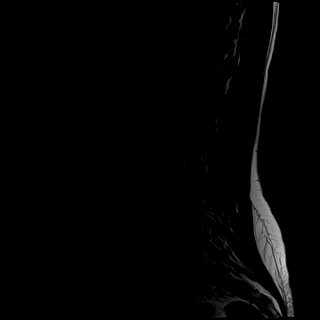
[im 7/12]
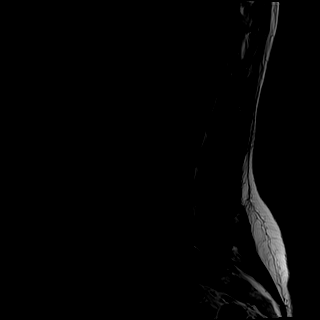
[im 9/12]
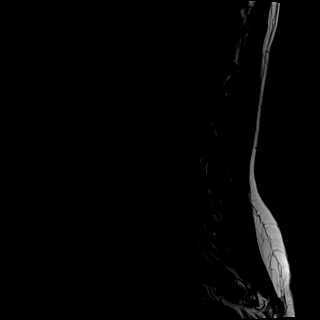
[im 12/12]
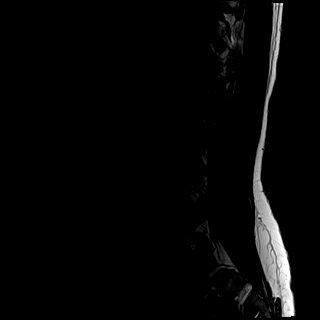

[Series 5: tirm sag · sagittal · 4.0mm · 0.55mm/px · 6 of 12 slices shown]
[im 1/12]
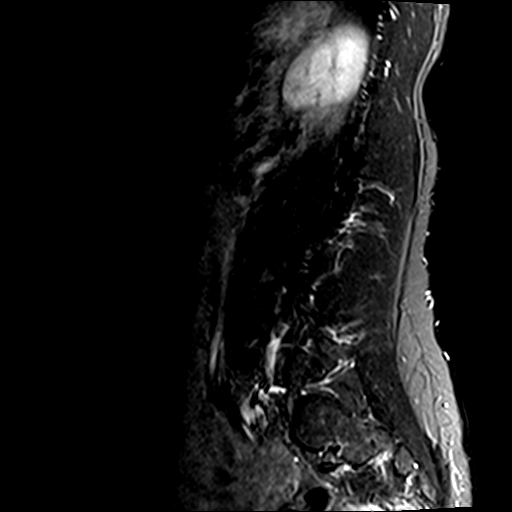
[im 3/12]
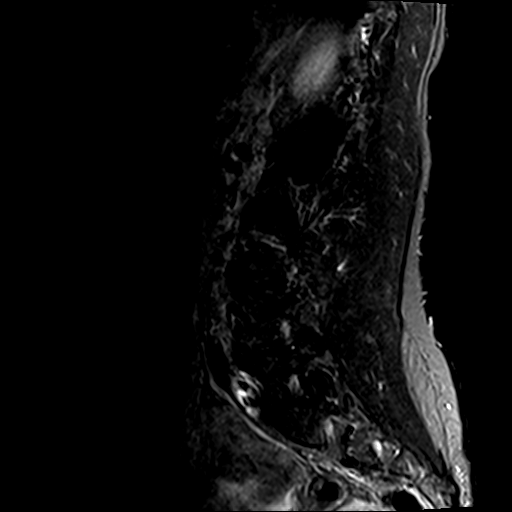
[im 5/12]
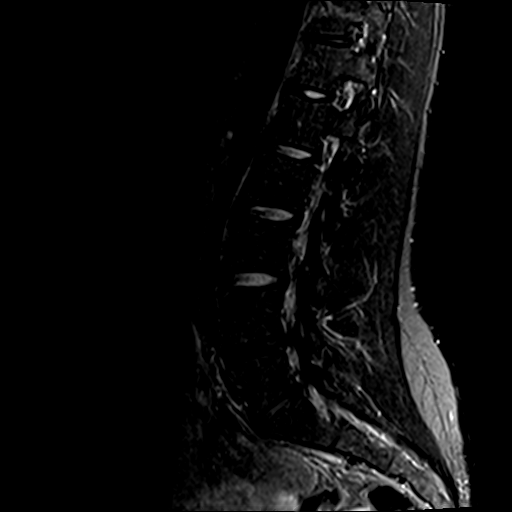
[im 7/12]
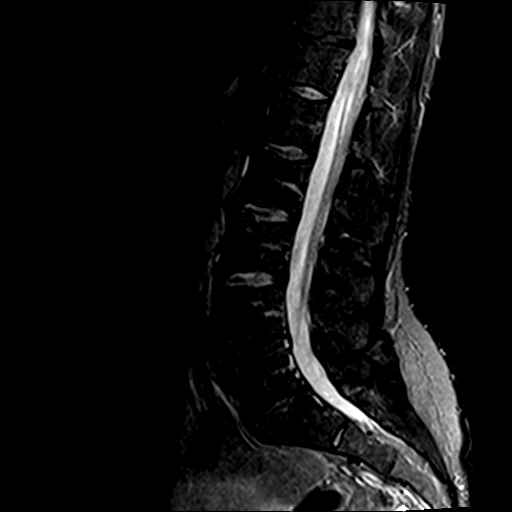
[im 9/12]
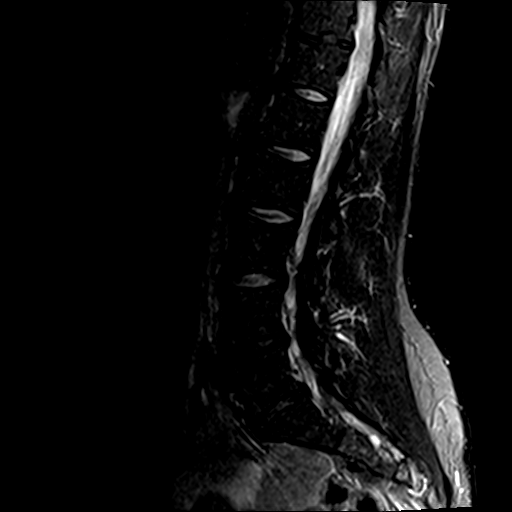
[im 12/12]
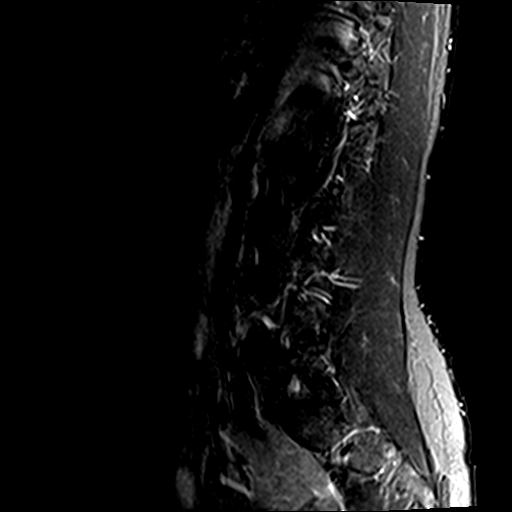

[Series 6: T1 · axial · 4.0mm · 0.78mm/px · z∈[-74,+140]mm · 9 of 32 slices shown (2 of 2)]
[im 1/32]
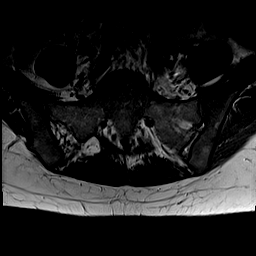
[im 5/32]
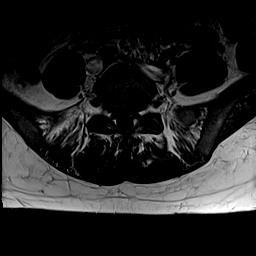
[im 9/32]
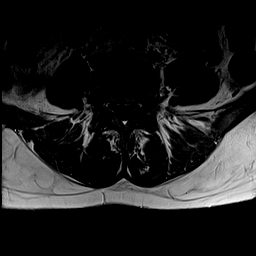
[im 14/32]
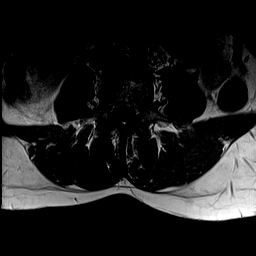
[im 16/32]
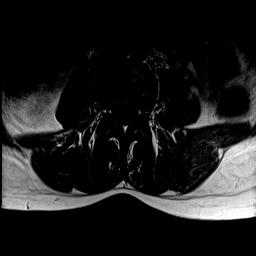
[im 18/32]
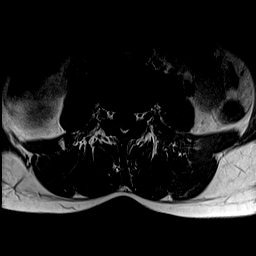
[im 23/32]
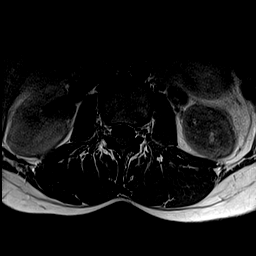
[im 27/32]
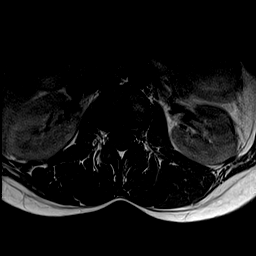
[im 32/32]
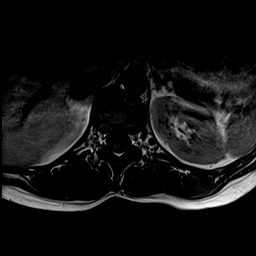

[Series 7: T2 · axial · 4.0mm · 0.78mm/px · z∈[-74,+140]mm · 15 of 32 slices shown]
[im 1/32]
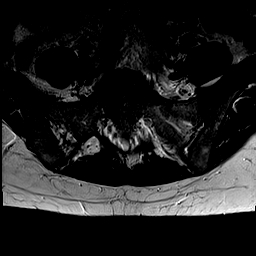
[im 3/32]
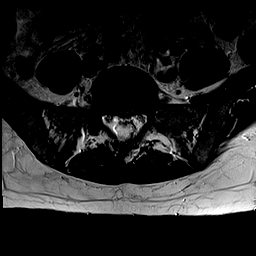
[im 5/32]
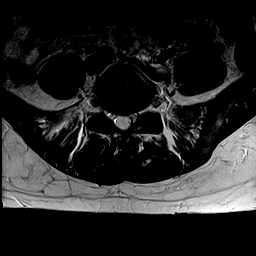
[im 7/32]
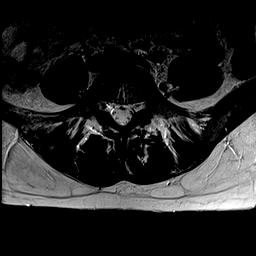
[im 9/32]
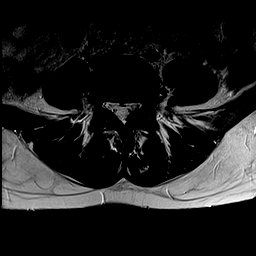
[im 12/32]
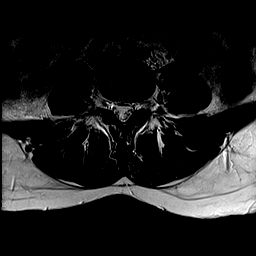
[im 14/32]
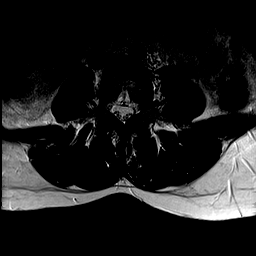
[im 16/32]
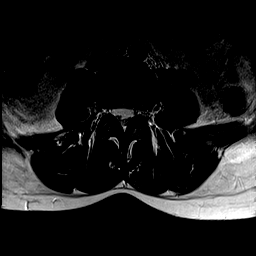
[im 18/32]
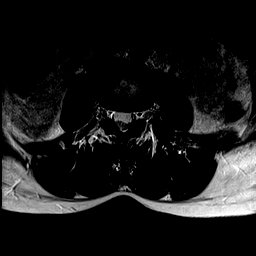
[im 20/32]
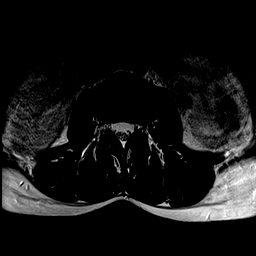
[im 23/32]
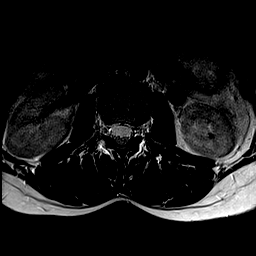
[im 25/32]
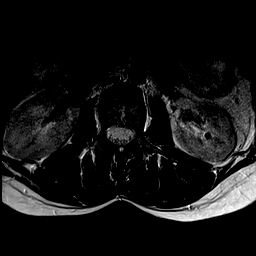
[im 27/32]
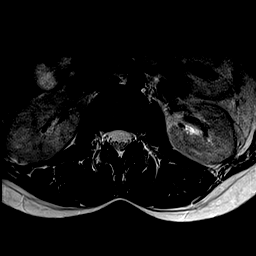
[im 29/32]
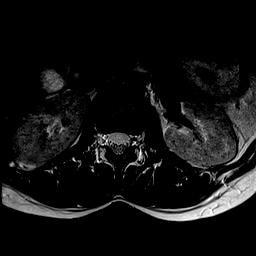
[im 32/32]
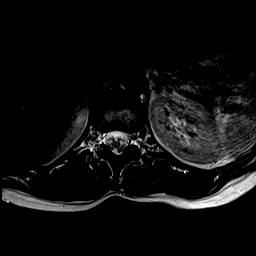

[42 of 48 positions shown; findings below may reference images not displayed]

FINDINGS: Segmentation: Standard. Lowest well-formed disc space labeled the
L5-S1 level.

Alignment: Physiologic with preservation of the normal lumbar
lordosis.

Vertebrae: Vertebral body height maintained without evidence for
acute or chronic fracture. Bone marrow signal intensity within
normal limits. No discrete or worrisome osseous lesions. No abnormal
marrow edema.

Conus medullaris and cauda equina: Conus extends to the L1 level.
Conus and cauda equina appear normal.

Paraspinal and other soft tissues: Unremarkable.

Disc levels:

T11-12: Disc desiccation with intervertebral disc space narrowing
with small chronic endplate Schmorl's nodes. Minimal disc bulge. No
stenosis.

T12-L1: Unremarkable.

L1-2:  Unremarkable.

L2-3:  Unremarkable.

L3-4: Minimal annular disc bulge. Mild facet hypertrophy. No
significant spinal stenosis. Foramina remain patent.

L4-5: Diffuse disc bulge with disc desiccation. Superimposed small
central disc protrusion with associated annular fissure. Mild facet
hypertrophy. Resultant mild canal with mild left greater than right
lateral recess stenosis. Foramina remain patent.

L5-S1: Chronic intervertebral disc space narrowing with diffuse disc
bulge and disc desiccation. Superimposed small central disc
protrusion contacts the descending S1 nerve roots as they course
through the lateral recesses, slightly more prominent on the right.
Mild bilateral lateral recess stenosis. Central canal remains
patent. No foraminal narrowing.
IMPRESSION: 1. Small central disc protrusion at L5-S1, contacting both of the
descending S1 nerve roots as they course through the lateral
recesses, slightly greater on the right. Finding could resultant
right lower extremity radicular symptoms.
2. Small central disc protrusion with annular fissure at L4-5 with
resultant mild left greater than right lateral recess stenosis.

## 2023-09-01 ENCOUNTER — Encounter: Payer: Self-pay | Admitting: Advanced Practice Midwife
